# Patient Record
Sex: Female | Born: 1935
Health system: Southern US, Community
[De-identification: ages and names within clinical notes are randomized; demographics above are authoritative.]

## PROBLEM LIST (undated history)

## (undated) DIAGNOSIS — R0789 Other chest pain: Secondary | ICD-10-CM

## (undated) DIAGNOSIS — F411 Generalized anxiety disorder: Secondary | ICD-10-CM

## (undated) DIAGNOSIS — J209 Acute bronchitis, unspecified: Secondary | ICD-10-CM

## (undated) DIAGNOSIS — M545 Low back pain, unspecified: Secondary | ICD-10-CM

## (undated) DIAGNOSIS — E7800 Pure hypercholesterolemia, unspecified: Secondary | ICD-10-CM

## (undated) DIAGNOSIS — J329 Chronic sinusitis, unspecified: Secondary | ICD-10-CM

## (undated) DIAGNOSIS — R002 Palpitations: Secondary | ICD-10-CM

## (undated) DIAGNOSIS — D126 Benign neoplasm of colon, unspecified: Secondary | ICD-10-CM

## (undated) DIAGNOSIS — K219 Gastro-esophageal reflux disease without esophagitis: Secondary | ICD-10-CM

## (undated) DIAGNOSIS — R51 Headache: Secondary | ICD-10-CM

## (undated) DIAGNOSIS — E039 Hypothyroidism, unspecified: Secondary | ICD-10-CM

## (undated) DIAGNOSIS — K644 Residual hemorrhoidal skin tags: Secondary | ICD-10-CM

## (undated) DIAGNOSIS — J309 Allergic rhinitis, unspecified: Secondary | ICD-10-CM

## (undated) DIAGNOSIS — M1990 Unspecified osteoarthritis, unspecified site: Secondary | ICD-10-CM

## (undated) DIAGNOSIS — K589 Irritable bowel syndrome without diarrhea: Secondary | ICD-10-CM

## (undated) HISTORY — PX: CATARACT EXTRACTION: SUR2

## (undated) HISTORY — PX: VESICOVAGINAL FISTULA CLOSURE W/ TAH: SUR271

## (undated) HISTORY — DX: Low back pain: M54.5

## (undated) HISTORY — DX: Palpitations: R00.2

## (undated) HISTORY — DX: Chronic sinusitis, unspecified: J32.9

## (undated) HISTORY — DX: Gastro-esophageal reflux disease without esophagitis: K21.9

## (undated) HISTORY — DX: Residual hemorrhoidal skin tags: K64.4

## (undated) HISTORY — PX: ABDOMINAL HYSTERECTOMY: SHX81

## (undated) HISTORY — DX: Allergic rhinitis, unspecified: J30.9

## (undated) HISTORY — DX: Acute bronchitis, unspecified: J20.9

## (undated) HISTORY — DX: Benign neoplasm of colon, unspecified: D12.6

## (undated) HISTORY — PX: POLYPECTOMY: SHX149

## (undated) HISTORY — DX: Pure hypercholesterolemia, unspecified: E78.00

## (undated) HISTORY — DX: Generalized anxiety disorder: F41.1

## (undated) HISTORY — DX: Unspecified osteoarthritis, unspecified site: M19.90

## (undated) HISTORY — DX: Irritable bowel syndrome, unspecified: K58.9

## (undated) HISTORY — DX: Other chest pain: R07.89

## (undated) HISTORY — DX: Low back pain, unspecified: M54.50

## (undated) HISTORY — DX: Headache: R51

## (undated) HISTORY — DX: Hypothyroidism, unspecified: E03.9

---

## 1992-11-25 ENCOUNTER — Encounter: Payer: Self-pay | Admitting: Gastroenterology

## 1993-03-17 ENCOUNTER — Encounter: Payer: Self-pay | Admitting: Gastroenterology

## 1997-12-25 ENCOUNTER — Encounter: Payer: Self-pay | Admitting: Gastroenterology

## 1998-12-10 ENCOUNTER — Other Ambulatory Visit: Admission: RE | Admit: 1998-12-10 | Discharge: 1998-12-10 | Payer: Self-pay | Admitting: Gynecology

## 2000-01-17 ENCOUNTER — Encounter: Payer: Self-pay | Admitting: Gynecology

## 2000-01-17 ENCOUNTER — Encounter: Admission: RE | Admit: 2000-01-17 | Discharge: 2000-01-17 | Payer: Self-pay | Admitting: Gynecology

## 2000-01-20 ENCOUNTER — Encounter: Payer: Self-pay | Admitting: Gynecology

## 2000-01-20 ENCOUNTER — Encounter: Admission: RE | Admit: 2000-01-20 | Discharge: 2000-01-20 | Payer: Self-pay | Admitting: Gynecology

## 2000-03-24 ENCOUNTER — Encounter: Payer: Self-pay | Admitting: Pulmonary Disease

## 2000-03-24 ENCOUNTER — Ambulatory Visit (HOSPITAL_COMMUNITY): Admission: RE | Admit: 2000-03-24 | Discharge: 2000-03-24 | Payer: Self-pay | Admitting: Pulmonary Disease

## 2000-05-02 ENCOUNTER — Ambulatory Visit (HOSPITAL_COMMUNITY): Admission: RE | Admit: 2000-05-02 | Discharge: 2000-05-02 | Payer: Self-pay | Admitting: Gastroenterology

## 2000-05-02 ENCOUNTER — Encounter: Payer: Self-pay | Admitting: Gastroenterology

## 2000-05-07 ENCOUNTER — Inpatient Hospital Stay (HOSPITAL_COMMUNITY): Admission: EM | Admit: 2000-05-07 | Discharge: 2000-05-11 | Payer: Self-pay

## 2000-05-07 ENCOUNTER — Encounter: Payer: Self-pay | Admitting: Internal Medicine

## 2001-01-22 ENCOUNTER — Encounter: Payer: Self-pay | Admitting: Pulmonary Disease

## 2001-01-22 ENCOUNTER — Encounter: Admission: RE | Admit: 2001-01-22 | Discharge: 2001-01-22 | Payer: Self-pay | Admitting: Pulmonary Disease

## 2001-12-26 ENCOUNTER — Other Ambulatory Visit: Admission: RE | Admit: 2001-12-26 | Discharge: 2001-12-26 | Payer: Self-pay | Admitting: Gynecology

## 2002-01-10 ENCOUNTER — Encounter: Payer: Self-pay | Admitting: Gastroenterology

## 2002-01-10 ENCOUNTER — Ambulatory Visit (HOSPITAL_COMMUNITY): Admission: RE | Admit: 2002-01-10 | Discharge: 2002-01-10 | Payer: Self-pay | Admitting: Gastroenterology

## 2002-01-10 ENCOUNTER — Encounter (INDEPENDENT_AMBULATORY_CARE_PROVIDER_SITE_OTHER): Payer: Self-pay | Admitting: Specialist

## 2002-01-23 ENCOUNTER — Encounter: Payer: Self-pay | Admitting: Gynecology

## 2002-01-23 ENCOUNTER — Encounter: Admission: RE | Admit: 2002-01-23 | Discharge: 2002-01-23 | Payer: Self-pay | Admitting: Gynecology

## 2002-05-02 ENCOUNTER — Encounter: Payer: Self-pay | Admitting: Cardiology

## 2002-05-02 ENCOUNTER — Ambulatory Visit (HOSPITAL_COMMUNITY): Admission: RE | Admit: 2002-05-02 | Discharge: 2002-05-02 | Payer: Self-pay | Admitting: Cardiology

## 2002-11-26 ENCOUNTER — Encounter: Payer: Self-pay | Admitting: Gastroenterology

## 2003-01-28 ENCOUNTER — Encounter: Payer: Self-pay | Admitting: Pulmonary Disease

## 2003-01-28 ENCOUNTER — Encounter: Admission: RE | Admit: 2003-01-28 | Discharge: 2003-01-28 | Payer: Self-pay | Admitting: Pulmonary Disease

## 2003-06-21 ENCOUNTER — Emergency Department (HOSPITAL_COMMUNITY): Admission: EM | Admit: 2003-06-21 | Discharge: 2003-06-21 | Payer: Self-pay | Admitting: *Deleted

## 2003-06-21 ENCOUNTER — Encounter: Payer: Self-pay | Admitting: *Deleted

## 2004-02-03 ENCOUNTER — Other Ambulatory Visit: Admission: RE | Admit: 2004-02-03 | Discharge: 2004-02-03 | Payer: Self-pay | Admitting: Gynecology

## 2004-02-18 ENCOUNTER — Encounter: Admission: RE | Admit: 2004-02-18 | Discharge: 2004-02-18 | Payer: Self-pay | Admitting: Gynecology

## 2004-07-07 ENCOUNTER — Ambulatory Visit: Payer: Self-pay | Admitting: Pulmonary Disease

## 2004-07-13 ENCOUNTER — Ambulatory Visit: Payer: Self-pay | Admitting: Gastroenterology

## 2004-08-03 ENCOUNTER — Ambulatory Visit (HOSPITAL_COMMUNITY): Admission: RE | Admit: 2004-08-03 | Discharge: 2004-08-03 | Payer: Self-pay | Admitting: Gastroenterology

## 2004-08-03 ENCOUNTER — Ambulatory Visit: Payer: Self-pay | Admitting: Gastroenterology

## 2004-09-03 ENCOUNTER — Ambulatory Visit: Payer: Self-pay | Admitting: Pulmonary Disease

## 2004-10-20 ENCOUNTER — Ambulatory Visit: Payer: Self-pay | Admitting: Pulmonary Disease

## 2004-11-04 ENCOUNTER — Ambulatory Visit: Payer: Self-pay | Admitting: Pulmonary Disease

## 2004-11-12 ENCOUNTER — Ambulatory Visit: Payer: Self-pay | Admitting: Pulmonary Disease

## 2005-02-10 ENCOUNTER — Ambulatory Visit: Payer: Self-pay | Admitting: Pulmonary Disease

## 2005-02-23 ENCOUNTER — Encounter: Admission: RE | Admit: 2005-02-23 | Discharge: 2005-02-23 | Payer: Self-pay | Admitting: Gynecology

## 2005-02-23 ENCOUNTER — Other Ambulatory Visit: Admission: RE | Admit: 2005-02-23 | Discharge: 2005-02-23 | Payer: Self-pay | Admitting: Gynecology

## 2005-03-16 ENCOUNTER — Ambulatory Visit: Payer: Self-pay | Admitting: Gastroenterology

## 2005-04-19 ENCOUNTER — Ambulatory Visit: Payer: Self-pay | Admitting: Gastroenterology

## 2005-05-09 ENCOUNTER — Ambulatory Visit: Payer: Self-pay | Admitting: Gastroenterology

## 2005-05-11 ENCOUNTER — Ambulatory Visit: Payer: Self-pay | Admitting: Pulmonary Disease

## 2005-09-08 ENCOUNTER — Ambulatory Visit: Payer: Self-pay | Admitting: Pulmonary Disease

## 2005-10-11 ENCOUNTER — Ambulatory Visit: Payer: Self-pay | Admitting: Gastroenterology

## 2005-10-14 ENCOUNTER — Encounter: Admission: RE | Admit: 2005-10-14 | Discharge: 2005-10-14 | Payer: Self-pay | Admitting: Urology

## 2005-10-17 ENCOUNTER — Encounter (INDEPENDENT_AMBULATORY_CARE_PROVIDER_SITE_OTHER): Payer: Self-pay | Admitting: Specialist

## 2005-10-17 ENCOUNTER — Ambulatory Visit (HOSPITAL_BASED_OUTPATIENT_CLINIC_OR_DEPARTMENT_OTHER): Admission: RE | Admit: 2005-10-17 | Discharge: 2005-10-17 | Payer: Self-pay | Admitting: Urology

## 2005-12-23 ENCOUNTER — Ambulatory Visit: Payer: Self-pay | Admitting: Gastroenterology

## 2006-01-05 ENCOUNTER — Ambulatory Visit: Payer: Self-pay | Admitting: Pulmonary Disease

## 2006-03-01 ENCOUNTER — Other Ambulatory Visit: Admission: RE | Admit: 2006-03-01 | Discharge: 2006-03-01 | Payer: Self-pay | Admitting: Gynecology

## 2006-03-21 ENCOUNTER — Encounter: Admission: RE | Admit: 2006-03-21 | Discharge: 2006-03-21 | Payer: Self-pay | Admitting: Gynecology

## 2006-05-04 ENCOUNTER — Ambulatory Visit: Payer: Self-pay | Admitting: Pulmonary Disease

## 2006-11-02 ENCOUNTER — Ambulatory Visit: Payer: Self-pay | Admitting: Pulmonary Disease

## 2006-11-02 LAB — CONVERTED CEMR LAB
Basophils Absolute: 0.2 10*3/uL — ABNORMAL HIGH (ref 0.0–0.1)
Bilirubin, Direct: 0.1 mg/dL (ref 0.0–0.3)
Calcium: 10 mg/dL (ref 8.4–10.5)
Chloride: 104 meq/L (ref 96–112)
Direct LDL: 109.6 mg/dL
Eosinophils Absolute: 0.1 10*3/uL (ref 0.0–0.6)
Eosinophils Relative: 2.4 % (ref 0.0–5.0)
GFR calc Af Amer: 78 mL/min
GFR calc non Af Amer: 65 mL/min
HCT: 37.6 % (ref 36.0–46.0)
Hemoglobin: 12.5 g/dL (ref 12.0–15.0)
Lymphocytes Relative: 38.6 % (ref 12.0–46.0)
MCV: 86.3 fL (ref 78.0–100.0)
Monocytes Relative: 9.8 % (ref 3.0–11.0)
Neutro Abs: 2.6 10*3/uL (ref 1.4–7.7)
Platelets: 336 10*3/uL (ref 150–400)
Sodium: 141 meq/L (ref 135–145)
TSH: 3.98 microintl units/mL (ref 0.35–5.50)
Total CHOL/HDL Ratio: 4.6
Total Protein: 7.3 g/dL (ref 6.0–8.3)
VLDL: 43 mg/dL — ABNORMAL HIGH (ref 0–40)
WBC: 5.6 10*3/uL (ref 4.5–10.5)

## 2007-03-22 ENCOUNTER — Other Ambulatory Visit: Admission: RE | Admit: 2007-03-22 | Discharge: 2007-03-22 | Payer: Self-pay | Admitting: Gynecology

## 2007-04-04 ENCOUNTER — Encounter: Admission: RE | Admit: 2007-04-04 | Discharge: 2007-04-04 | Payer: Self-pay | Admitting: Gynecology

## 2007-04-30 ENCOUNTER — Ambulatory Visit: Payer: Self-pay | Admitting: Pulmonary Disease

## 2007-04-30 LAB — CONVERTED CEMR LAB
AST: 19 units/L (ref 0–37)
Albumin: 4.1 g/dL (ref 3.5–5.2)
BUN: 16 mg/dL (ref 6–23)
Basophils Absolute: 0 10*3/uL (ref 0.0–0.1)
CO2: 31 meq/L (ref 19–32)
Eosinophils Absolute: 0.2 10*3/uL (ref 0.0–0.6)
Eosinophils Relative: 2 % (ref 0.0–5.0)
GFR calc Af Amer: 62 mL/min
GFR calc non Af Amer: 51 mL/min
Glucose, Bld: 145 mg/dL — ABNORMAL HIGH (ref 70–99)
HCT: 37.7 % (ref 36.0–46.0)
Lymphocytes Relative: 19.2 % (ref 12.0–46.0)
MCHC: 33.6 g/dL (ref 30.0–36.0)
Monocytes Relative: 10.1 % (ref 3.0–11.0)
Platelets: 425 10*3/uL — ABNORMAL HIGH (ref 150–400)
Potassium: 4.9 meq/L (ref 3.5–5.1)
RBC: 4.45 M/uL (ref 3.87–5.11)
RDW: 12.6 % (ref 11.5–14.6)
Sodium: 149 meq/L — ABNORMAL HIGH (ref 135–145)
Total CHOL/HDL Ratio: 4.3
VLDL: 25 mg/dL (ref 0–40)
WBC: 11.9 10*3/uL — ABNORMAL HIGH (ref 4.5–10.5)

## 2007-05-07 ENCOUNTER — Ambulatory Visit: Payer: Self-pay | Admitting: Pulmonary Disease

## 2007-05-07 LAB — CONVERTED CEMR LAB
Ketones, ur: NEGATIVE mg/dL
Mucus, UA: NEGATIVE
Nitrite: POSITIVE — AB
Specific Gravity, Urine: 1.025 (ref 1.000–1.03)
Total Protein, Urine: 30 mg/dL — AB
Urine Glucose: NEGATIVE mg/dL
pH: 6 (ref 5.0–8.0)

## 2007-05-21 ENCOUNTER — Ambulatory Visit: Payer: Self-pay | Admitting: Gastroenterology

## 2007-10-29 DIAGNOSIS — M545 Low back pain, unspecified: Secondary | ICD-10-CM | POA: Insufficient documentation

## 2007-10-29 DIAGNOSIS — R0989 Other specified symptoms and signs involving the circulatory and respiratory systems: Secondary | ICD-10-CM | POA: Insufficient documentation

## 2007-10-29 DIAGNOSIS — F411 Generalized anxiety disorder: Secondary | ICD-10-CM | POA: Insufficient documentation

## 2007-10-29 DIAGNOSIS — J309 Allergic rhinitis, unspecified: Secondary | ICD-10-CM | POA: Insufficient documentation

## 2007-10-29 DIAGNOSIS — E7800 Pure hypercholesterolemia, unspecified: Secondary | ICD-10-CM | POA: Insufficient documentation

## 2007-10-29 DIAGNOSIS — K219 Gastro-esophageal reflux disease without esophagitis: Secondary | ICD-10-CM | POA: Insufficient documentation

## 2007-10-29 DIAGNOSIS — E039 Hypothyroidism, unspecified: Secondary | ICD-10-CM | POA: Insufficient documentation

## 2007-10-29 DIAGNOSIS — J329 Chronic sinusitis, unspecified: Secondary | ICD-10-CM | POA: Insufficient documentation

## 2007-10-29 DIAGNOSIS — D126 Benign neoplasm of colon, unspecified: Secondary | ICD-10-CM | POA: Insufficient documentation

## 2007-10-29 DIAGNOSIS — M1990 Unspecified osteoarthritis, unspecified site: Secondary | ICD-10-CM | POA: Insufficient documentation

## 2007-10-29 DIAGNOSIS — I10 Essential (primary) hypertension: Secondary | ICD-10-CM | POA: Insufficient documentation

## 2007-10-30 ENCOUNTER — Ambulatory Visit: Payer: Self-pay | Admitting: Pulmonary Disease

## 2007-10-30 DIAGNOSIS — R51 Headache: Secondary | ICD-10-CM | POA: Insufficient documentation

## 2007-10-30 DIAGNOSIS — R519 Headache, unspecified: Secondary | ICD-10-CM | POA: Insufficient documentation

## 2007-11-19 ENCOUNTER — Telehealth: Payer: Self-pay | Admitting: Pulmonary Disease

## 2007-11-20 ENCOUNTER — Telehealth: Payer: Self-pay | Admitting: Pulmonary Disease

## 2007-11-21 LAB — CONVERTED CEMR LAB
AST: 21 units/L (ref 0–37)
Alkaline Phosphatase: 66 units/L (ref 39–117)
BUN: 13 mg/dL (ref 6–23)
Basophils Absolute: 0.1 10*3/uL (ref 0.0–0.1)
Bilirubin, Direct: 0.2 mg/dL (ref 0.0–0.3)
CO2: 28 meq/L (ref 19–32)
Calcium: 9.8 mg/dL (ref 8.4–10.5)
Chloride: 103 meq/L (ref 96–112)
Creatinine, Ser: 0.9 mg/dL (ref 0.4–1.2)
Direct LDL: 117.6 mg/dL
GFR calc Af Amer: 78 mL/min
Glucose, Bld: 130 mg/dL — ABNORMAL HIGH (ref 70–99)
HCT: 37 % (ref 36.0–46.0)
Hemoglobin: 12.3 g/dL (ref 12.0–15.0)
MCHC: 33.2 g/dL (ref 30.0–36.0)
MCV: 84 fL (ref 78.0–100.0)
Monocytes Relative: 11.3 % — ABNORMAL HIGH (ref 3.0–11.0)
Platelets: 261 10*3/uL (ref 150–400)
RBC: 4.4 M/uL (ref 3.87–5.11)
RDW: 13.7 % (ref 11.5–14.6)
TSH: 0.34 microintl units/mL — ABNORMAL LOW (ref 0.35–5.50)
Total Protein: 7.1 g/dL (ref 6.0–8.3)
Triglycerides: 228 mg/dL (ref 0–149)
VLDL: 46 mg/dL — ABNORMAL HIGH (ref 0–40)

## 2007-11-23 ENCOUNTER — Telehealth (INDEPENDENT_AMBULATORY_CARE_PROVIDER_SITE_OTHER): Payer: Self-pay | Admitting: *Deleted

## 2007-12-06 ENCOUNTER — Telehealth (INDEPENDENT_AMBULATORY_CARE_PROVIDER_SITE_OTHER): Payer: Self-pay | Admitting: *Deleted

## 2008-02-25 ENCOUNTER — Telehealth: Payer: Self-pay | Admitting: Pulmonary Disease

## 2008-02-25 ENCOUNTER — Encounter: Payer: Self-pay | Admitting: Pulmonary Disease

## 2008-02-27 ENCOUNTER — Encounter: Payer: Self-pay | Admitting: Pulmonary Disease

## 2008-02-29 ENCOUNTER — Encounter: Payer: Self-pay | Admitting: Pulmonary Disease

## 2008-03-25 ENCOUNTER — Encounter: Payer: Self-pay | Admitting: Pulmonary Disease

## 2008-04-08 ENCOUNTER — Encounter: Admission: RE | Admit: 2008-04-08 | Discharge: 2008-04-08 | Payer: Self-pay | Admitting: Gynecology

## 2008-04-29 ENCOUNTER — Ambulatory Visit: Payer: Self-pay | Admitting: Pulmonary Disease

## 2008-04-29 DIAGNOSIS — R002 Palpitations: Secondary | ICD-10-CM | POA: Insufficient documentation

## 2008-07-02 ENCOUNTER — Telehealth: Payer: Self-pay | Admitting: Pulmonary Disease

## 2008-07-03 ENCOUNTER — Ambulatory Visit: Payer: Self-pay | Admitting: Adult Health

## 2008-07-03 DIAGNOSIS — R0789 Other chest pain: Secondary | ICD-10-CM | POA: Insufficient documentation

## 2008-07-04 ENCOUNTER — Ambulatory Visit: Payer: Self-pay | Admitting: Cardiovascular Disease

## 2008-07-09 ENCOUNTER — Ambulatory Visit: Payer: Self-pay

## 2008-07-09 ENCOUNTER — Encounter: Payer: Self-pay | Admitting: Pulmonary Disease

## 2008-08-13 ENCOUNTER — Ambulatory Visit: Payer: Self-pay | Admitting: Pulmonary Disease

## 2008-08-13 DIAGNOSIS — R7309 Other abnormal glucose: Secondary | ICD-10-CM | POA: Insufficient documentation

## 2008-08-15 ENCOUNTER — Ambulatory Visit: Payer: Self-pay | Admitting: Pulmonary Disease

## 2008-08-15 LAB — CONVERTED CEMR LAB
AST: 18 units/L (ref 0–37)
Alkaline Phosphatase: 57 units/L (ref 39–117)
Basophils Absolute: 0.2 10*3/uL — ABNORMAL HIGH (ref 0.0–0.1)
Basophils Relative: 2.9 % (ref 0.0–3.0)
Chloride: 107 meq/L (ref 96–112)
Eosinophils Absolute: 0.2 10*3/uL (ref 0.0–0.7)
Glucose, Bld: 112 mg/dL — ABNORMAL HIGH (ref 70–99)
HCT: 35.9 % — ABNORMAL LOW (ref 36.0–46.0)
Hemoglobin: 12.1 g/dL (ref 12.0–15.0)
Hgb A1c MFr Bld: 6.3 % — ABNORMAL HIGH (ref 4.6–6.0)
MCHC: 33.7 g/dL (ref 30.0–36.0)
Monocytes Relative: 11.3 % (ref 3.0–12.0)
Neutro Abs: 1.8 10*3/uL (ref 1.4–7.7)
Potassium: 3.6 meq/L (ref 3.5–5.1)
RBC: 4.05 M/uL (ref 3.87–5.11)
Total CHOL/HDL Ratio: 6
Total Protein: 7.3 g/dL (ref 6.0–8.3)
Triglycerides: 166 mg/dL — ABNORMAL HIGH (ref 0–149)
VLDL: 33 mg/dL (ref 0–40)
WBC: 5.3 10*3/uL (ref 4.5–10.5)

## 2008-08-20 ENCOUNTER — Telehealth (INDEPENDENT_AMBULATORY_CARE_PROVIDER_SITE_OTHER): Payer: Self-pay | Admitting: *Deleted

## 2008-09-01 ENCOUNTER — Telehealth (INDEPENDENT_AMBULATORY_CARE_PROVIDER_SITE_OTHER): Payer: Self-pay | Admitting: *Deleted

## 2008-09-15 ENCOUNTER — Ambulatory Visit: Payer: Self-pay | Admitting: Pulmonary Disease

## 2008-10-03 ENCOUNTER — Telehealth (INDEPENDENT_AMBULATORY_CARE_PROVIDER_SITE_OTHER): Payer: Self-pay | Admitting: *Deleted

## 2008-11-26 ENCOUNTER — Encounter: Payer: Self-pay | Admitting: Pulmonary Disease

## 2008-12-31 ENCOUNTER — Encounter: Admission: RE | Admit: 2008-12-31 | Discharge: 2008-12-31 | Payer: Self-pay | Admitting: Orthopedic Surgery

## 2009-02-24 ENCOUNTER — Ambulatory Visit: Payer: Self-pay | Admitting: Pulmonary Disease

## 2009-02-24 LAB — CONVERTED CEMR LAB
BUN: 20 mg/dL (ref 6–23)
CO2: 29 meq/L (ref 19–32)
Calcium: 10 mg/dL (ref 8.4–10.5)
Creatinine, Ser: 1.3 mg/dL — ABNORMAL HIGH (ref 0.4–1.2)
GFR calc non Af Amer: 41.86 mL/min (ref >60)
Potassium: 3.9 meq/L (ref 3.5–5.1)
Sodium: 142 meq/L (ref 135–145)

## 2009-04-22 ENCOUNTER — Encounter: Admission: RE | Admit: 2009-04-22 | Discharge: 2009-04-22 | Payer: Self-pay | Admitting: Pulmonary Disease

## 2009-04-29 ENCOUNTER — Telehealth: Payer: Self-pay | Admitting: Gastroenterology

## 2009-05-01 ENCOUNTER — Ambulatory Visit: Payer: Self-pay | Admitting: Gastroenterology

## 2009-05-01 DIAGNOSIS — K644 Residual hemorrhoidal skin tags: Secondary | ICD-10-CM | POA: Insufficient documentation

## 2009-05-01 DIAGNOSIS — K625 Hemorrhage of anus and rectum: Secondary | ICD-10-CM | POA: Insufficient documentation

## 2009-05-04 LAB — CONVERTED CEMR LAB
Basophils Absolute: 0.1 10*3/uL (ref 0.0–0.1)
Eosinophils Absolute: 0.1 10*3/uL (ref 0.0–0.7)
HCT: 36.6 % (ref 36.0–46.0)
Hemoglobin: 12.2 g/dL (ref 12.0–15.0)
Lymphs Abs: 2.7 10*3/uL (ref 0.7–4.0)
Monocytes Relative: 10 % (ref 3.0–12.0)
Neutrophils Relative %: 45.1 % (ref 43.0–77.0)
RBC: 4.02 M/uL (ref 3.87–5.11)
RDW: 13.6 % (ref 11.5–14.6)
WBC: 6.4 10*3/uL (ref 4.5–10.5)

## 2009-05-27 ENCOUNTER — Encounter: Payer: Self-pay | Admitting: Gastroenterology

## 2009-05-28 ENCOUNTER — Ambulatory Visit: Payer: Self-pay | Admitting: Gastroenterology

## 2009-05-28 DIAGNOSIS — K921 Melena: Secondary | ICD-10-CM | POA: Insufficient documentation

## 2009-05-28 DIAGNOSIS — Z860100 Personal history of colon polyps, unspecified: Secondary | ICD-10-CM | POA: Insufficient documentation

## 2009-06-19 ENCOUNTER — Encounter: Payer: Self-pay | Admitting: Gastroenterology

## 2009-06-22 ENCOUNTER — Telehealth: Payer: Self-pay | Admitting: Gastroenterology

## 2009-06-24 ENCOUNTER — Telehealth: Payer: Self-pay | Admitting: Gastroenterology

## 2009-06-24 ENCOUNTER — Telehealth (INDEPENDENT_AMBULATORY_CARE_PROVIDER_SITE_OTHER): Payer: Self-pay | Admitting: *Deleted

## 2009-06-26 ENCOUNTER — Encounter: Payer: Self-pay | Admitting: Gastroenterology

## 2009-07-01 ENCOUNTER — Telehealth (INDEPENDENT_AMBULATORY_CARE_PROVIDER_SITE_OTHER): Payer: Self-pay | Admitting: *Deleted

## 2009-07-28 ENCOUNTER — Telehealth: Payer: Self-pay | Admitting: Pulmonary Disease

## 2009-07-30 ENCOUNTER — Telehealth (INDEPENDENT_AMBULATORY_CARE_PROVIDER_SITE_OTHER): Payer: Self-pay | Admitting: *Deleted

## 2009-08-25 ENCOUNTER — Ambulatory Visit: Payer: Self-pay | Admitting: Pulmonary Disease

## 2009-08-26 DIAGNOSIS — K589 Irritable bowel syndrome without diarrhea: Secondary | ICD-10-CM | POA: Insufficient documentation

## 2009-08-26 LAB — CONVERTED CEMR LAB
AST: 20 units/L (ref 0–37)
Albumin: 4 g/dL (ref 3.5–5.2)
Basophils Relative: 1.1 % (ref 0.0–3.0)
Bilirubin, Direct: 0.1 mg/dL (ref 0.0–0.3)
CO2: 24 meq/L (ref 19–32)
Eosinophils Relative: 0.9 % (ref 0.0–5.0)
HCT: 37.2 % (ref 36.0–46.0)
HDL: 43.1 mg/dL (ref >39.00)
Lymphs Abs: 2 10*3/uL (ref 0.7–4.0)
MCHC: 33.9 g/dL (ref 30.0–36.0)
MCV: 92.5 fL (ref 78.0–100.0)
Monocytes Absolute: 0.5 10*3/uL (ref 0.1–1.0)
Monocytes Relative: 5.9 % (ref 3.0–12.0)
Neutro Abs: 6.5 10*3/uL (ref 1.4–7.7)
Potassium: 3.7 meq/L (ref 3.5–5.1)
RBC: 4.02 M/uL (ref 3.87–5.11)
RDW: 13.5 % (ref 11.5–14.6)
Specific Gravity, Urine: 1.025 (ref 1.000–1.030)
Total Protein, Urine: 100 mg/dL
Triglycerides: 177 mg/dL — ABNORMAL HIGH (ref 0.0–149.0)
Urine Glucose: NEGATIVE mg/dL
Vit D, 25-Hydroxy: 65 ng/mL (ref 30–89)

## 2009-08-31 ENCOUNTER — Telehealth (INDEPENDENT_AMBULATORY_CARE_PROVIDER_SITE_OTHER): Payer: Self-pay | Admitting: *Deleted

## 2009-09-10 ENCOUNTER — Telehealth: Payer: Self-pay | Admitting: Pulmonary Disease

## 2009-11-20 ENCOUNTER — Telehealth: Payer: Self-pay | Admitting: Pulmonary Disease

## 2009-11-23 ENCOUNTER — Telehealth: Payer: Self-pay | Admitting: Pulmonary Disease

## 2009-11-27 ENCOUNTER — Encounter: Payer: Self-pay | Admitting: Adult Health

## 2009-11-27 ENCOUNTER — Ambulatory Visit: Payer: Self-pay | Admitting: Pulmonary Disease

## 2009-11-29 DIAGNOSIS — R21 Rash and other nonspecific skin eruption: Secondary | ICD-10-CM | POA: Insufficient documentation

## 2009-11-30 ENCOUNTER — Ambulatory Visit: Payer: Self-pay | Admitting: Pulmonary Disease

## 2009-11-30 ENCOUNTER — Encounter: Payer: Self-pay | Admitting: Adult Health

## 2009-11-30 LAB — CONVERTED CEMR LAB
Alkaline Phosphatase: 58 units/L (ref 39–117)
BUN: 17 mg/dL (ref 6–23)
Basophils Absolute: 0.1 10*3/uL (ref 0.0–0.1)
Bilirubin Urine: NEGATIVE
Bilirubin Urine: NEGATIVE
Calcium: 9.7 mg/dL (ref 8.4–10.5)
Eosinophils Absolute: 0.1 10*3/uL (ref 0.0–0.7)
Folate: >20 ng/mL
GFR calc non Af Amer: 63.87 mL/min (ref >60)
Hemoglobin: 12.1 g/dL (ref 12.0–15.0)
Ketones, ur: NEGATIVE mg/dL
Ketones, ur: NEGATIVE mg/dL
Leukocytes, UA: NEGATIVE
Leukocytes, UA: NEGATIVE
Lymphocytes Relative: 32.8 % (ref 12.0–46.0)
MCV: 90.3 fL (ref 78.0–100.0)
Monocytes Absolute: 0.5 10*3/uL (ref 0.1–1.0)
Neutro Abs: 2.9 10*3/uL (ref 1.4–7.7)
Nitrite: NEGATIVE
Potassium: 3.8 meq/L (ref 3.5–5.1)
Sodium: 143 meq/L (ref 135–145)
Specific Gravity, Urine: 1.02 (ref 1.000–1.030)
TSH: 0.27 microintl units/mL — ABNORMAL LOW (ref 0.35–5.50)
Urobilinogen, UA: 0.2 (ref 0.0–1.0)
Vitamin B-12: 397 pg/mL (ref 211–911)
WBC: 5.4 10*3/uL (ref 4.5–10.5)
pH: 5.5 (ref 5.0–8.0)

## 2009-12-23 ENCOUNTER — Ambulatory Visit: Payer: Self-pay | Admitting: Pulmonary Disease

## 2010-01-28 ENCOUNTER — Ambulatory Visit: Payer: Self-pay | Admitting: Pulmonary Disease

## 2010-02-03 ENCOUNTER — Telehealth (INDEPENDENT_AMBULATORY_CARE_PROVIDER_SITE_OTHER): Payer: Self-pay | Admitting: *Deleted

## 2010-02-22 ENCOUNTER — Ambulatory Visit: Payer: Self-pay | Admitting: Pulmonary Disease

## 2010-03-29 ENCOUNTER — Telehealth: Payer: Self-pay | Admitting: Pulmonary Disease

## 2010-05-12 ENCOUNTER — Encounter: Admission: RE | Admit: 2010-05-12 | Discharge: 2010-05-12 | Payer: Self-pay | Admitting: Pulmonary Disease

## 2010-05-24 ENCOUNTER — Telehealth (INDEPENDENT_AMBULATORY_CARE_PROVIDER_SITE_OTHER): Payer: Self-pay | Admitting: *Deleted

## 2010-05-31 ENCOUNTER — Telehealth: Payer: Self-pay | Admitting: Pulmonary Disease

## 2010-08-13 ENCOUNTER — Telehealth (INDEPENDENT_AMBULATORY_CARE_PROVIDER_SITE_OTHER): Payer: Self-pay | Admitting: *Deleted

## 2010-08-23 ENCOUNTER — Ambulatory Visit: Payer: Self-pay | Admitting: Pulmonary Disease

## 2010-08-28 LAB — CONVERTED CEMR LAB
Albumin: 4.2 g/dL (ref 3.5–5.2)
Alkaline Phosphatase: 63 units/L (ref 39–117)
Calcium: 9.6 mg/dL (ref 8.4–10.5)
Direct LDL: 143.5 mg/dL
GFR calc non Af Amer: 55.18 mL/min — ABNORMAL LOW (ref >60.00)
Glucose, Bld: 102 mg/dL — ABNORMAL HIGH (ref 70–99)
HDL: 39.3 mg/dL (ref >39.00)
Sodium: 143 meq/L (ref 135–145)
Total Bilirubin: 0.4 mg/dL (ref 0.3–1.2)
Total CHOL/HDL Ratio: 6
VLDL: 31.2 mg/dL (ref 0.0–40.0)

## 2010-10-03 ENCOUNTER — Encounter: Payer: Self-pay | Admitting: Orthopedic Surgery

## 2010-10-05 ENCOUNTER — Telehealth: Payer: Self-pay | Admitting: Pulmonary Disease

## 2010-10-10 LAB — CONVERTED CEMR LAB
Alkaline Phosphatase: 60 units/L (ref 39–117)
Alkaline Phosphatase: 74 units/L (ref 39–117)
BUN: 14 mg/dL (ref 6–23)
Basophils Absolute: 0.1 10*3/uL (ref 0.0–0.1)
Basophils Relative: 1.5 % (ref 0.0–3.0)
Basophils Relative: 2.1 % (ref 0.0–3.0)
Calcium: 10.2 mg/dL (ref 8.4–10.5)
Calcium: 9.4 mg/dL (ref 8.4–10.5)
Chloride: 104 meq/L (ref 96–112)
Creatinine, Ser: 1 mg/dL (ref 0.4–1.2)
Eosinophils Absolute: 0.1 10*3/uL (ref 0.0–0.7)
Eosinophils Relative: 2 % (ref 0.0–5.0)
Eosinophils Relative: 2.4 % (ref 0.0–5.0)
GFR calc non Af Amer: 57 mL/min
Glucose, Bld: 109 mg/dL — ABNORMAL HIGH (ref 70–99)
HCT: 35.7 % — ABNORMAL LOW (ref 36.0–46.0)
HDL: 41.4 mg/dL (ref >39.00)
Hemoglobin: 12.1 g/dL (ref 12.0–15.0)
Hemoglobin: 12.6 g/dL (ref 12.0–15.0)
Hgb A1c MFr Bld: 6.6 % — ABNORMAL HIGH (ref 4.6–6.0)
LDL Cholesterol: 111 mg/dL — ABNORMAL HIGH (ref 0–99)
Lymphocytes Relative: 35.1 % (ref 12.0–46.0)
Lymphs Abs: 1.7 10*3/uL (ref 0.7–4.0)
MCHC: 34.1 g/dL (ref 30.0–36.0)
MCHC: 35.3 g/dL (ref 30.0–36.0)
Monocytes Absolute: 0.4 10*3/uL (ref 0.1–1.0)
Monocytes Absolute: 0.6 10*3/uL (ref 0.1–1.0)
Monocytes Relative: 7.2 % (ref 3.0–12.0)
Neutro Abs: 2.9 10*3/uL (ref 1.4–7.7)
Neutrophils Relative %: 50.6 % (ref 43.0–77.0)
Platelets: 233 10*3/uL (ref 150.0–400.0)
Potassium: 3.7 meq/L (ref 3.5–5.1)
RDW: 14.3 % (ref 11.5–14.6)
Saturation Ratios: 19 % — ABNORMAL LOW (ref 20.0–50.0)
Sodium: 141 meq/L (ref 135–145)
TSH: 4.02 microintl units/mL (ref 0.35–5.50)
Total Bilirubin: 0.4 mg/dL (ref 0.3–1.2)
Total Bilirubin: 0.6 mg/dL (ref 0.3–1.2)
Total CHOL/HDL Ratio: 4
Total CHOL/HDL Ratio: 5.1
VLDL: 31.6 mg/dL (ref 0.0–40.0)
VLDL: 37 mg/dL (ref 0–40)

## 2010-10-12 NOTE — Assessment & Plan Note (Signed)
Summary: Acute NP office visit - rash   Copy to:  n/a Primary Provider/Referring Provider:  Alroy Dust, MD  CC:  rash on palms of hands, arms and both legs w/ itching and redness - has seen dermatologist x3 for this, losss of appetite/losing weight, and and states that her GYN wonders if she has diabetes.  History of Present Illness: 77  y/o female with known history of GERD, Hypothyroidism, HTN.    ~  Dec09:  she was evaluated by Walker Kehr 10/09 for atypical CP-  EKG w/ NSSTTWA;  MYOVIEW was normal...  she had f/u w/ DrRDavis for Urology 6/09- doing OK...  she had f/u w/ DrLomax for GYN 7/09- doing OK...   ~  February 24, 2009:  under incr stress due to husb illness- fall w/ shoulder surg & post-op rehab at [facility]...  she's had incr LBP w/ eval DrRendall, & shot from [facility]- improved... she's been on the incr dose of Simvastatin (40mg ) for 6 mo & numbers improved...   ~  August 25, 2009:  seen by DrStark for incr GERD & rectal bleeding- he changed Protonix to Dexilant w/ improvement & wanted to sched EGD/ Colon but she refused due to the prep (we will request DrStark to consider the alternative prep for her...  BP controlled, doing satis on the Simva40 & Lopid600, & DM controlled on diet alone...   November 27, 2009 -Presents for work in visit. Presents for rash on palms of hands, arms and both legs w/ itching and redness - has seen dermatologist x3 for this, losss of appetite/losing weight, and states that her GYN wonders if she has diabetes. She was unaware that her borderline blood sugars/DM was consistent with "real DM". She is with her daughter today. Pt has been under an enormous amount of stress with husband illness/dementia. Trying move in with daughter to have more help with her husband. Lots of anxiety and depression . Now on zoloft 50mg . Helps but not controlling her anxiety well. Denies chest pain, dyspnea, orthopnea, hemoptysis, fever, n/v/d, edema, headache.         Medications Prior to Update: 1)  Nitroquick 0.4 Mg  Subl (Nitroglycerin) .... Take One Tablet Under The Tongue As Needed 2)  Atenolol 50 Mg  Tabs (Atenolol) .Marland Kitchen.. 1 By Mouth Once Daily 3)  Simvastatin 40 Mg Tabs (Simvastatin) .... Take 1 Tab By Mouth At Bedtime.Marland KitchenMarland Kitchen 4)  Lopid 600 Mg Tabs (Gemfibrozil) .... Take One Tablet By Mouth Two Times A Day 5)  Synthroid 88 Mcg  Tabs (Levothyroxine Sodium) .Marland Kitchen.. 1 Tab By Mouth Once Daily 6)  Dexilant 60 Mg Cpdr (Dexlansoprazole) .... Take 1 Tab By Mouth Once Daily - 30 Min Before The 1st Meal of The Day... 7)  Salsalate 750 Mg  Tabs (Salsalate) .... Take One Tabet By Mouth Two Times A Day 8)  Caltrate 600+d 600-400 Mg-Unit  Tabs (Calcium Carbonate-Vitamin D) .... Take 1 Tablet By Mouth Once A Day 9)  Multivitamins   Tabs (Multiple Vitamin) .... Take 1 Tablet By Mouth Once A Day 10)  Fioricet 50-325-40 Mg  Tabs (Butalbital-Apap-Caffeine) .... Take 1 Cap By Mouth Up To Every 6 H As Needed For Headache Pain... 11)  Librax 2.5-5 Mg Caps (Clidinium-Chlordiazepoxide) .... Take 1 Cap Up To Three Times Daily As Needed For Abd Cramping... 12)  Anucort-Hc 25 Mg Supp (Hydrocortisone Acetate) .... Put One in Rectum 2 Times Daily For 7 Days. 13)  Ciprofloxacin Hcl 250 Mg Tabs (Ciprofloxacin Hcl) .... Take One Tablet  By Mouth Two Times A Day Until Gone 14)  Diflucan 150 Mg Tabs (Fluconazole) .... Take As Directed 15)  Mmw .... One Teaspoon Gargle and Swallow Four Times Daily As Needed  Current Medications (verified): 1)  Nitroquick 0.4 Mg  Subl (Nitroglycerin) .... Take One Tablet Under The Tongue As Needed 2)  Atenolol 50 Mg  Tabs (Atenolol) .Marland Kitchen.. 1 By Mouth Once Daily 3)  Simvastatin 40 Mg Tabs (Simvastatin) .... Take 1 Tab By Mouth At Bedtime.Marland KitchenMarland Kitchen 4)  Lopid 600 Mg Tabs (Gemfibrozil) .... Take One Tablet By Mouth Two Times A Day 5)  Synthroid 88 Mcg  Tabs (Levothyroxine Sodium) .Marland Kitchen.. 1 Tab By Mouth Once Daily 6)  Dexilant 60 Mg Cpdr (Dexlansoprazole) .... Take 1 Tab By  Mouth Once Daily - 30 Min Before The 1st Meal of The Day... 7)  Salsalate 750 Mg  Tabs (Salsalate) .... Take One Tabet By Mouth Two Times A Day 8)  Caltrate 600+d 600-400 Mg-Unit  Tabs (Calcium Carbonate-Vitamin D) .... Take 1 Tablet By Mouth Once A Day 9)  Multivitamins   Tabs (Multiple Vitamin) .... Take 1 Tablet By Mouth Once A Day 10)  Fioricet 50-325-40 Mg  Tabs (Butalbital-Apap-Caffeine) .... Take 1 Cap By Mouth Up To Every 6 H As Needed For Headache Pain... 11)  Librax 2.5-5 Mg Caps (Clidinium-Chlordiazepoxide) .... Take 1 Cap Up To Three Times Daily As Needed For Abd Cramping... 12)  Anucort-Hc 25 Mg Supp (Hydrocortisone Acetate) .... Put One in Rectum 2 Times Daily For 7 Days. 13)  Mmw .... One Teaspoon Gargle and Swallow Four Times Daily As Needed 14)  Nystatin-Triamcinolone 100000-0.1 Unit/gm-% Oint (Nystatin-Triamcinolone) .... Use As Directed 15)  Clobex 0.05 % Lotn (Clobetasol Propionate) .... Use As Directed  Allergies (verified): 1)  ! Penicillin  Past History:  Family History: Last updated: 05-29-2009 mother deceased age 4 from heart problems father deceased age 14 from heart attack 1 sibling alive age 72  hx of heart problems 1 sibling alive age 63 1 sibling alive age 31  hx of heart disease 1 sibling alive age 20  hx of COPD 1 sister with hx of  breast cancer 1 brother with hx of prostate cancer daughter with breast cancer No FH of Colon Cancer:  Social History: Last updated: 04/29/2008 never smoked exposed to second hand smoke drinks 1 cup caffeine daily no etoh married 2 children  Risk Factors: Smoking Status: never (08/13/2008)  Past Medical History: HYPERTENSION (ICD-401.9) - controlled on ATENOLOL 50mg /d, and ASA 81mg /d... BP=122/68 here today & similar at home... denies HA, fatigue, visual changes, CP, dizziness, syncope, dyspnea, edema, etc...  ~  baseline EKG shows NSR, NSSTTWA...  ~  NuclearStressTest 8/03 showed ? mild ischemia apical/ lat  wall, EF=69%... referred to [facility]-  ~  cath 8/03 showed normal coronaries...  CHEST PAIN, ATYPICAL (ICD-786.59) & PALPITATIONS (ICD-785.1) - eval 10/09 by Walker Kehr w/ repeat Myoview= neg, no infarct or ischemia, EF= 81%...  HYPERCHOLESTEROLEMIA (ICD-272.0) - on ZOCOR 40mg /d & LOPID 600Bid...   ~  FLP 8/08 showed TChol 167, TG 127, HDL 38, LDL 103...  ~  FLP 2/09 showed TChol 208, TG 228, HDL 39, LDL 118... continue same med, better diet+ exercise.  ~  FLP 8/09 showed TChol 205, TG 187, HDL 40, LDL 124... rec to take meds regularly & repeat.  ~  FLP 12/09 on Simva20+LopidBid  showed TChol 218, TG 166, HDL 37, LDL 134... rec> incr Simva40.  ~  FLP 6/10 on Simva40+LopidBid showed TChol 187,  TG, 129, HDL 48, LDL 113  ~  FLP 12/10 on same showed TChol 182, TG 177, HDL 43, LDL 104... contin Rx, better diet.  DIABETES MELLITUS, BORDERLINE (ICD-790.29) - on diet alone...  ~  labs prev w/ FBS betw 125 - 145...  ~  labs 8/09 showed BS= 125, HgA1c= 6.6 on diet therapy...  ~  labs 12/09 showed BS= 112, A1c= 6.3.Marland Kitchen. rec> contin diet rx.  ~  labs 12/10 showed BS= 114, A1c= 6.3.Marland KitchenMarland Kitchen continue diet rx.  HYPOTHYROIDISM (ICD-244.9) - on SYNTHROID 61mcg/d...  ~  TSH 8/08 was 7.04 on Synthroid 83mcg/d, therefore increased to 78mcg/d...  ~  TSH 2/09 on 46mcg/d was 0.34... keep same...  ~  labs 8/09 showed TSH= 4.02  ~  labs 12/09 showed TSH= 4.23  ~  labs 12/10 showed TSH= 1.24  GERD (ICD-530.81) - protonix changed to DEXILANT 60mg /d & improved per pt...   ~  last EGD was 11/05 showing gastritis... rec for PPI Bid indefinitely...  ~  8/09: c/o incr nocturnal reflux symptoms- rec> change Prevacid to Protonix, add Reglan 10mg Qhs, elevate HOB, avoid caffeine, etc...    Past Surgical History: COLONIC POLYPS (ICD-211.3) & IRRITABLE BOWEL SYNDROME (ICD-564.1) - she requests LIBRAX but doesn't want the generic "yellow" caps, she wants the non-generic "blue" caps...  ~  last colonoscopy was 8/06 by DrStark showing  normal except for hems... f/u planned 77yrs.  ~  sched for colonoscopy 11/10 (due to blood in stool) but she refused due to prep required...  DEGENERATIVE JOINT DISEASE (ICD-715.90) & BACK PAIN, LUMBAR (ICD-724.2) - she is off the  ETODOLAC & FLEXERIL... takes calcium and vits...  Dr Priscille Kluver put her on SALSALATE 750mg Bid & referrred her to [facility] for shots in back- she reports improvement.  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  ANXIETY (ICD-300.00) - she was taking bothe Librium10 & Xanax 0.25mg ... she prefers the ALPRAZOLAM 0.25mg  Tid Prn...  Health Maintenance - GYN = DrLomax...  Urology = DrRDavis...  she had Shingles vaccine 6/09 at [facility]... she had PNEUMOVAX 2/09 here... she had the seasonal flu shot 10/09, and the 2010 Flu vaccine...   Hysterectomy Polypectomy Cataract Surgery both eyes  Review of Systems      See HPI  Vital Signs:  Patient profile:   77 year old female Height:      62 inches Weight:      127.25 pounds BMI:     23.36 O2 Sat:      97 % on Room air Temp:     97.0 degrees F oral Pulse rate:   61 / minute BP sitting:   130 / 78  (left arm) Cuff size:   regular  Vitals Entered By: Boone Master CNA (November 27, 2009 11:23 AM)  O2 Flow:  Room air CC: rash on palms of hands, arms and both legs w/ itching and redness - has seen dermatologist x3 for this, losss of appetite/losing weight, and states that her GYN wonders if she has diabetes Is Patient Diabetic? No Comments Medications reviewed with patient Daytime contact number verified with patient. Boone Master CNA  November 27, 2009 11:23 AM    Physical Exam  Additional Exam:  WD, WN, 77y/o WF in NAD... GENERAL:  Alert & oriented; pleasant & cooperative... HEENT:  [place]/AT, EOM-wnl, PERRLA, EACs-clear, TMs-wnl, NOSE-clear, THROAT-clear & wnl. NECK:  Supple w/ fairROM; no JVD; normal carotid impulses w/o bruits; no thyromegaly or nodules  palpated; no lymphadenopathy. CHEST:  Clear to P & A; without wheezes/ rales/ or rhonchi. HEART:  Regular Rhythm; without murmurs/ rubs/ or gallops. ABDOMEN:  Soft & nontender; normal bowel sounds; no organomegaly or masses detected. EXT: without deformities, mildarthritic changes; no varicose veins/ venous insuffic/ or edema. NEURO:  CN's intact; no focal neuro deficits... DERM: excoriated patch along palms     Impression & Recommendations:  Problem # 1:  ANXIETY (ICD-300.00)  Long discussion w/ pt and family regarding caregiver stress/strain REC: labs pending.  Increase Zoloft 100mg  once daily  May use xanax 0.25mg  1 by mouth two times a day as needed anxiety.  I will call with lab results.  follow up 4 weeks and as needed  Please contact office for sooner follow up if symptoms do not improve or worsen  Her updated medication list for this problem includes:    Zoloft 100 Mg Tabs (Sertraline hcl) .Marland Kitchen... 1 by mouth once daily    Alprazolam 0.25 Mg Tabs (Alprazolam) .Marland Kitchen... 1 by mouth two times a day as needed anxiety  Orders: Est. Patient Level IV ([address])  Problem # 2:  SKIN RASH (ICD-782.1)  ?etiology cont to follow up with dermatiology.   Her updated medication list for this problem includes:    Nystatin-triamcinolone 100000-0.1 Unit/gm-% Oint (Nystatin-triamcinolone) ..... Use as directed    Clobex 0.05 % Lotn (Clobetasol propionate) ..... Use as directed    Clobex 0.05 % Lotn (Clobetasol propionate) .Marland Kitchen... Apply to rash as needed  Orders: Est. Patient Level IV (60454)  Medications Added to Medication List This Visit: 1)  Nystatin-triamcinolone 100000-0.1 Unit/gm-% Oint (Nystatin-triamcinolone) .... Use as directed 2)  Clobex 0.05 % Lotn (Clobetasol propionate) .... Use as directed 3)  Zoloft 100 Mg Tabs (Sertraline hcl) .Marland Kitchen.. 1 by mouth once daily 4)  Alprazolam 0.25 Mg Tabs (Alprazolam) .Marland Kitchen.. 1 by mouth two times a day as needed anxiety 5)  Clobex 0.05 % Lotn (Clobetasol  propionate) .... Apply to rash as needed  Complete Medication List: 1)  Nitroquick 0.4 Mg Subl (Nitroglycerin) .... Take one tablet under the tongue as needed 2)  Atenolol 50 Mg Tabs (Atenolol) .Marland Kitchen.. 1 by mouth once daily 3)  Simvastatin 40 Mg Tabs (Simvastatin) .... Take 1 tab by mouth at bedtime.Marland KitchenMarland Kitchen 4)  Lopid 600 Mg Tabs (Gemfibrozil) .... Take one tablet by mouth two times a day 5)  Synthroid 88 Mcg Tabs (Levothyroxine sodium) .Marland Kitchen.. 1 tab by mouth once daily 6)  Dexilant 60 Mg Cpdr (Dexlansoprazole) .... Take 1 tab by mouth once daily - 30 min before the 1st meal of the day... 7)  Salsalate 750 Mg Tabs (Salsalate) .... Take one tabet by mouth two times a day 8)  Caltrate 600+d 600-400 Mg-unit Tabs (Calcium carbonate-vitamin d) .... Take 1 tablet by mouth once a day 9)  Multivitamins Tabs (Multiple vitamin) .... Take 1 tablet by mouth once a day 10)  Fioricet 50-325-40 Mg Tabs (Butalbital-apap-caffeine) .... Take 1 cap by mouth up to every 6 h as needed for headache pain... 11)  Librax 2.5-5 Mg Caps (Clidinium-chlordiazepoxide) .... Take 1 cap up to three times daily as needed for abd cramping... 12)  Anucort-hc 25 Mg Supp (Hydrocortisone acetate) .... Put one in rectum 2 times daily for 7 days. 13)  Mmw  .... One teaspoon gargle and swallow four times daily as needed 14)  Nystatin-triamcinolone 100000-0.1 Unit/gm-% Oint (Nystatin-triamcinolone) .... Use as directed 15)  Clobex 0.05 % Lotn (Clobetasol propionate) .... Use as directed 16)  Zoloft 100 Mg Tabs (Sertraline hcl) .Marland Kitchen.. 1 by mouth once daily 17)  Alprazolam 0.25 Mg Tabs (Alprazolam) .Marland Kitchen.. 1 by mouth two times a day as needed anxiety 18)  Clobex 0.05 % Lotn (Clobetasol propionate) .... Apply to rash as needed  Other Orders: T-Urine Culture (Spectrum Order) [id number]) TLB-BMP (Basic Metabolic Panel-BMET) (80048-METABOL) TLB-CBC Platelet - w/Differential (85025-CBCD) TLB-Hepatic/Liver Function Pnl (80076-HEPATIC) TLB-TSH (Thyroid  Stimulating Hormone) (84443-TSH) TLB-B12 + Folate Pnl (09811_91478-G95/AOZ) TLB-Udip w/ Micro (81001-URINE) TLB-A1C / Hgb A1C (Glycohemoglobin) (83036-A1C)  Patient Instructions: 1)  Increase Zoloft 100mg  once daily  2)  May use xanax 0.25mg  1 by mouth two times a day as needed anxiety.  3)  I will call with lab results.  4)  follow up 4 weeks and as needed  5)  Please contact office for sooner follow up if symptoms do not improve or worsen  Prescriptions: CLOBEX 0.05 % LOTN (CLOBETASOL PROPIONATE) Apply to rash as needed  #1 x 2   Entered and Authorized by:   Rubye Oaks NP   Signed by:   Adrienne Trombetta NP on 11/27/2009   Method used:   Print then Give to Patient   RxID:   [id number] ALPRAZOLAM 0.25 MG TABS (ALPRAZOLAM) 1 by mouth two times a day as needed anxiety  #60 x 0   Entered and Authorized by:   Rubye Oaks NP   Signed by:   Pietra Zuluaga NP on 11/27/2009   Method used:   Print then Give to Patient   RxID:   [id number] ZOLOFT 100 MG TABS (SERTRALINE HCL) 1 by mouth once daily  #30 x 5   Entered and Authorized by:   Rubye Oaks NP   Signed by:   Bea Duren NP on 11/27/2009   Method used:   Electronically to        [id number]* (retail)       [address]       [place], [place]  [address]       Ph: [phone] or [phone]       Fax: [phone]   RxID:   [id number]    Immunization History:  Influenza Immunization History:    Influenza:  historical (06/12/2009)  Pneumovax Immunization History:    Pneumovax:  historical (02/10/2009)

## 2010-10-12 NOTE — Progress Notes (Signed)
Summary: rx for sleep  Phone Note Call from Patient Call back at Home Phone [phone]   Caller: Daughter Reason for Call: Talk to Nurse Summary of Call: Patient's daughter--besty--calling asking if Dr. Kriste Basque would call her mother in rx for sleep. Initial call taken by: Lehman Prom,  August 13, 2010 4:18 PM  Follow-up for Phone Call        Spoke with daughter-pt has demenita-moved in with daughter-appt on 12-14; any way to get RX for something to help sleep until then.Reynaldo Minium CMA  August 13, 2010 4:21 PM    [facility] [address]   Additional Follow-up for Phone Call Additional follow up Details #1::        PER SN-okay to give pt Seroquel 50mg  #30 Take 1 by mouth at bedtime no refills.Reynaldo Minium CMA  August 13, 2010 4:30 PM    Called RX to pharmacy and daughter of pt is aware.Reynaldo Minium CMA  August 13, 2010 4:36 PM     New/Updated Medications: SEROQUEL 50 MG TABS (QUETIAPINE FUMARATE) take 1 by mouth at bedtime as needed Prescriptions: SEROQUEL 50 MG TABS (QUETIAPINE FUMARATE) take 1 by mouth at bedtime as needed  #30 x 0   Entered by:   Reynaldo Minium CMA   Authorized by:   Michele Mcalpine MD   Signed by:   Reynaldo Minium CMA on 08/13/2010   Method used:   [facility]        [facility] [phone][facility] (retail)       [address] [place], [place]  [address]       Ph: [phone] or [phone]       Fax: [phone]   RxID:   [id number]

## 2010-10-12 NOTE — Progress Notes (Signed)
Summary: pred dose pak clarrification  Phone Note From Pharmacy Call back at [phone]   Caller: Lodema Hong Call For: nadel  Summary of Call: Needs to know if prednisone is to be for 12 days or 6 days. Initial call taken by: Darletta Moll,  May 24, 2010 2:53 PM  Follow-up for Phone Call        Per previous phone note, pred dose pak is supposed to be 6 day pack.    [facility], [facility].  Informed her of this.  She verbalized understanding.  Follow-up by: Gweneth Dimitri RN,  May 24, 2010 3:10 PM

## 2010-10-12 NOTE — Progress Notes (Signed)
Summary: sinus infection  Phone Note Call from Patient Call back at [phone]   Caller: Daughter betsy Call For: nadel Summary of Call: need something for sinus infection called to pharmacy Lodema Hong [phone] Initial call taken by: Rickard Patience,  May 31, 2010 4:13 PM  Follow-up for Phone Call           daughter called me back---facial pain, with drainage in her throat--has been like this all weekend.  no fever, with dry cough.  please advise. thanks Allergies:    PCN Randell Loop [facility]  May 31, 2010 5:07 PM   Additional Follow-up for Phone Call Additional follow up Details #1::        per SN---ok for pt to have avelox 400mg  #7  1 by mouth once daily until gone. with no refills. this has been sent to pts pharmacy and pts daughter is aware Randell Loop [facility]  May 31, 2010 5:35 PM     New/Updated Medications: AVELOX 400 MG TABS (MOXIFLOXACIN HCL) Take 1 tablet by mouth once a day Prescriptions: AVELOX 400 MG TABS (MOXIFLOXACIN HCL) Take 1 tablet by mouth once a day  #7 x 0   Entered by:   Randell Loop CMA   Authorized by:   Michele Mcalpine MD   Signed by:   Randell Loop CMA on 05/31/2010   Method used:   Electronically to        [facility] [phone]* (retail)       [address] [place], [place]  [address]       Ph: [phone] or [phone]       Fax: [phone]   RxID:   [id number]

## 2010-10-12 NOTE — Progress Notes (Signed)
Summary: Rash > prednsione rx  Phone Note Call from Patient Call back at [phone]   Caller: Daughter//betsy Call For: nadel Summary of Call: States her mom is itching on hands, arms, and legs, wants to have a medrol dose pack called in, pls advise.//[facility] [phone] Initial call taken by: Darletta Moll,  May 24, 2010 1:51 PM  Follow-up for Phone Call        Called, spoke with pt's daughter Tamela Oddi.  Per Tamela Oddi, pt has a red, itchy rash on hands, legs, and arms x 2 wks.  Denies rash being warm to touch, denies using any new detergants.  Believes it is mainly from stress.  Has tried cortizone cream, benadryl, and aveno body wash and lotion -- no worse or better.  Requesting to try medrol dose pak.  [facility] Allergies: PCN Dr. Kriste Basque, pls advise.  Thanks! Follow-up by: Gweneth Dimitri RN,  May 24, 2010 2:19 PM  Additional Follow-up for Phone Call Additional follow up Details #1::        per SN----medrol is not avaliable----ok for pt to have prednisone dosepak  5mg     6 day pack.  thanks Randell Loop CMA  May 24, 2010 2:34 PM   [facility] pak [facility].  Called, spoke with pt's daughter, Tamela Oddi.  She is aware of this and aware of above per SN.  She verbalized understanding.   Additional Follow-up by: Gweneth Dimitri RN,  May 24, 2010 2:44 PM    New/Updated Medications: PREDNISONE (PAK) 5 MG TABS (PREDNISONE) take as directed Prescriptions: PREDNISONE (PAK) 5 MG TABS (PREDNISONE) take as directed  #1 x 0   Entered by:   Gweneth Dimitri RN   Authorized by:   Michele Mcalpine MD   Signed by:   Gweneth Dimitri RN on 05/24/2010   Method used:   Electronically to        [facility] [phone]* (retail)       [address] [place], [place]  [address]       Ph: [phone] or [phone]       Fax: [phone]   RxID:   [id number]

## 2010-10-12 NOTE — Progress Notes (Signed)
Summary: retuning call to [facility]  Phone Note Call from Patient Call back at [phone]   Caller: Mary Alexander - daughter Call For: Mary Alexander Reason for Call: Talk to Nurse, Lab or Test Results Summary of Call: returning call to [facility] - possibly lab results Initial call taken by: Eugene Gavia,  Feb 03, 2010 2:18 PM  Follow-up for Phone Call        per SN's append to 01-28-10 labs: labs ok, will discuss at 02-20-10 ov and vit d level looks good.  called spoke with Mary, pt's daughter.  advised of lab results as stated by SN.  Mary verbalized her understanding and was okay with waiting until ov as she will be coming with her mother. Follow-up by: Boone Master CNA/MA,  Feb 03, 2010 2:28 PM

## 2010-10-12 NOTE — Assessment & Plan Note (Signed)
Summary: rov 6 months///kp   Primary Care Provider:  Alroy Dust, MD  CC:  6 month ROV & review of mult medical issues....  History of Present Illness: 77 y/o WF here for a follow up visit... she has mult somatic complaints & mult medical problems as below:   ~  Dec09:  she was evaluated by Walker Kehr 10/09 for atypical CP-  EKG w/ NSSTTWA;  MYOVIEW was normal...  she had f/u w/ [facility] 6/09- doing OK...  she had f/u w/ DrLomax for GYN 7/09- doing OK...   ~  Jun10:  under incr stress due to husb illness- fall w/ shoulder surg & post-op rehab at [facility]...  she's had incr LBP w/ eval DrRendall, & shot from [facility]- improved... she's been on the incr dose of Simvastatin (40mg ) for 6 mo & numbers improved...  ~  Dec10:  seen by DrStark for incr GERD & rectal bleeding- he changed Protonix to Dexilant w/ improvement & wanted to sched EGD/ Colon but she refused due to the prep (she will request DrStark to consider the alternative prep for her)...  BP controlled, doing satis on the Simva40 & Lopid600, & DM controlled on diet alone...    ~  February 22, 2010:  states she's been doing "bad" w/ "yeast" found by DrLomax & NIOEVOJJ- now resolved... she had BMD 5/11 by GYN w/ TScores -2.0 in [facility], but OK in other sites & DrLomax only rec Caltrate + MVI... she's been taking Mobic/ Tramadol Prn for arthritis pain, and increased the Zoloft to 100mg /d w/ improvement... labs done 5/11> reviewed w/ pt today...    Current Problems:  ALLERGIC RHINITIS (ICD-477.9) - on ALLEGRA 180mg /d just Prn now... SINUSITIS (ICD-473.9) ASTHMATIC BRONCHITIS, ACUTE (ICD-466.0) - no recent exas etc...   HYPERTENSION (ICD-401.9) - controlled on ATENOLOL 50mg /d, and ASA 81mg /d... BP=130/66 here today & similar at home... denies HA, fatigue, visual changes, CP, dizziness, syncope, dyspnea, edema, etc...  ~  baseline EKG shows NSR, NSSTTWA...  ~  NuclearStressTest 8/03 showed ? mild ischemia apical/ lat wall,  EF=69%... referred to [facility]-  ~  cath 8/03 showed normal coronaries...  CHEST PAIN, ATYPICAL (ICD-786.59) & PALPITATIONS (ICD-785.1) - eval 10/09 by Walker Kehr w/ repeat Myoview= neg, no infarct or ischemia, EF= 81%...  she denies recent symptoms.  HYPERCHOLESTEROLEMIA (ICD-272.0) - on ZOCOR 40mg /d & LOPID 600Bid...   ~  FLP 8/08 showed TChol 167, TG 127, HDL 38, LDL 103...  ~  FLP 2/09 showed TChol 208, TG 228, HDL 39, LDL 118... continue same med, better diet+ exercise.  ~  FLP 8/09 showed TChol 205, TG 187, HDL 40, LDL 124... rec to take meds regularly & repeat.  ~  FLP 12/09 on Simva20+LopidBid  showed TChol 218, TG 166, HDL 37, LDL 134... rec> incr Simva40.  ~  FLP 6/10 on Simva40+LopidBid showed TChol 187, TG, 129, HDL 48, LDL 113  ~  FLP 12/10 on same showed TChol 182, TG 177, HDL 43, LDL 104... contin Rx, better diet.  ~  FLP 6/11 showed TChol 184, TG 158, HDL 41, LDL 111  DIABETES MELLITUS, BORDERLINE (ICD-790.29) - on diet alone...  ~  labs prev w/ FBS betw 125 - 145...  ~  labs 8/09 showed BS= 125, HgA1c= 6.6 on diet therapy...  ~  labs 12/09 showed BS= 112, A1c= 6.3.Marland Kitchen. rec> contin diet rx.  ~  labs 12/10 showed BS= 114, A1c= 6.3.Marland KitchenMarland Kitchen continue diet rx.  ~  labs 5/11 showed BS= 109, A1c= not  done.  HYPOTHYROIDISM (ICD-244.9) - on SYNTHROID 32mcg/d...  ~  TSH 8/08 was 7.04 on Synthroid 51mcg/d, therefore increased to 6mcg/d...  ~  TSH 2/09 on 46mcg/d was 0.34... keep same...  ~  labs 8/09 showed TSH= 4.02  ~  labs 12/09 showed TSH= 4.23  ~  labs 12/10 showed TSH= 1.24  ~  labs 5/11 showed TSH= 2.37  GERD (ICD-530.81) - protonix changed to DEXILANT 60mg /d & improved per pt...   ~  last EGD was 11/05 showing gastritis... rec for PPI Bid indefinitely...  ~  8/09: c/o incr nocturnal reflux symptoms- rec> change Prevacid to Protonix, add Reglan 10mg Qhs, elevate HOB, avoid caffeine, etc...  ~  10/10: saw DrStark w/ incr GERD- changed to DEXILANT 60mg /d which helped- planned EGD (not  done).  COLONIC POLYPS (ICD-211.3) & IRRITABLE BOWEL SYNDROME (ICD-564.1) - she requests LIBRAX but doesn't want the generic "yellow" caps, she wants the non-generic "blue" caps...  ~  last colonoscopy was 8/06 by DrStark showing normal except for hems... f/u planned 77yrs.  ~  sched for colonoscopy 11/10 (due to blood in stool) but she refused due to prep required...  DEGENERATIVE JOINT DISEASE (ICD-715.90) & BACK PAIN, LUMBAR (ICD-724.2) - she is off the  Etodolac & Flexeril... takes MOBIC 7.5mg  Prn & TRAMADOL 50mg  Prn + calcium and vits...  Dr Priscille Kluver referrred her to [facility] for shots in back- she reports improvement.  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  ANXIETY (ICD-300.00) - she was taking both Librium10 & Xanax 0.25mg ... she prefers the ALPRAZOLAM 0.25mg  Tid Prn... now on ZOLOFT 100mg /d & improved...   Health Maintenance - GYN = DrLomax...  Urology = DrRDavis...  she had Shingles vaccine 6/09 at [facility]... she had PNEUMOVAX 2/09 here... she had the seasonal flu shot 10/09, and the 2010 Flu vaccine...   Preventive Screening-Counseling & Management  Alcohol-Tobacco     Smoking Status: never  Allergies: 1)  ! Penicillin  Comments:  Nurse/Medical Assistant: The patient's medications and allergies were reviewed with the patient and were updated in the Medication and Allergy Lists.  Past History:  Past Medical History: ALLERGIC RHINITIS (ICD-477.9) SINUSITIS (ICD-473.9) ASTHMATIC BRONCHITIS, ACUTE (ICD-466.0) HYPERTENSION (ICD-401.9) CHEST PAIN, ATYPICAL (ICD-786.59) PALPITATIONS (ICD-785.1) HYPERCHOLESTEROLEMIA (ICD-272.0) DIABETES MELLITUS, BORDERLINE (ICD-790.29) HYPOTHYROIDISM (ICD-244.9) GERD (ICD-530.81) COLONIC POLYPS (ICD-211.3) IRRITABLE BOWEL SYNDROME (ICD-564.1) HEMORRHOIDS-EXTERNAL (ICD-455.3) DEGENERATIVE JOINT DISEASE (ICD-715.90) BACK PAIN, LUMBAR (ICD-724.2) HEADACHE, MIXED  (ICD-784.0) ANXIETY (ICD-300.00)  Past Surgical History: Hysterectomy Polypectomy Cataract Surgery both eyes  Family History: Reviewed history from 05/01/2009 and no changes required. mother deceased age 72 from heart problems father deceased age 61 from heart attack 1 sibling alive age 69  hx of heart problems 1 sibling alive age 49 1 sibling alive age 23  hx of heart disease 1 sibling alive age 60  hx of COPD 1 sister with hx of  breast cancer 1 brother with hx of prostate cancer daughter with breast cancer No FH of Colon Cancer:  Social History: Reviewed history from 04/29/2008 and no changes required. never smoked exposed to second hand smoke drinks 1 cup caffeine daily no etoh married 2 children  Review of Systems      See HPI       The patient complains of dyspnea on exertion and difficulty walking.  The patient denies anorexia, fever, weight loss, weight gain, vision loss, decreased hearing, hoarseness, chest pain, syncope, peripheral edema, prolonged cough, headaches, hemoptysis, abdominal pain, melena,  hematochezia, severe indigestion/heartburn, hematuria, incontinence, suspicious skin lesions, transient blindness, depression, unusual weight change, abnormal bleeding, enlarged lymph nodes, and angioedema.    Vital Signs:  Patient profile:   77 year old female Height:      62 inches Weight:      123 pounds BMI:     22.58 O2 Sat:      96 % on Room air Temp:     97.1 degrees F oral Pulse rate:   61 / minute BP sitting:   130 / 66  (right arm) Cuff size:   regular  Vitals Entered By: Randell Loop CMA (February 22, 2010 11:12 AM)  O2 Sat at Rest %:  96 O2 Flow:  Room air CC: 6 month ROV & review of mult medical issues... Is Patient Diabetic? Yes Pain Assessment Patient in pain? no      Comments meds updated today   Physical Exam  Additional Exam:  WD, WN, 77 y/o WF in NAD... GENERAL:  Alert & oriented; pleasant & cooperative... HEENT:  /AT, EOM-wnl,  PERRLA, EACs-clear, TMs-wnl, NOSE-clear, THROAT-clear & wnl. NECK:  Supple w/ fairROM; no JVD; normal carotid impulses w/o bruits; no thyromegaly or nodules palpated; no lymphadenopathy. CHEST:  Clear to P & A; without wheezes/ rales/ or rhonchi. HEART:  Regular Rhythm; without murmurs/ rubs/ or gallops. ABDOMEN:  Soft & nontender; normal bowel sounds; no organomegaly or masses detected. EXT: without deformities, mildarthritic changes; no varicose veins/ venous insuffic/ or edema. NEURO:  CN's intact; no focal neuro deficits... DERM:  No lesions noted; no rash etc...    MISC. Report  Procedure date:  02/22/2010  Findings:      DATA REVIEWED:  labs from 5/19.11... OV w/ TP 12/23/09... OV w/ DrLomax & BMD from 01/28/10... all reviewed w/ pt & discussed during the OV...  SN   Impression & Recommendations:  Problem # 1:  HYPERTENSION (ICD-401.9) BP controlled on meds>  continue same. Her updated medication list for this problem includes:    Atenolol 50 Mg Tabs (Atenolol) .Marland Kitchen... 1 by mouth once daily  Problem # 2:  HYPERCHOLESTEROLEMIA (ICD-272.0) FLP is stable/ unchanged... needs better diet & continue same meds for now.. we discussed poss switching the Simva40 to Crestor20 but she doesn't want the added expense & tol current regimen OK. Her updated medication list for this problem includes:    Simvastatin 40 Mg Tabs (Simvastatin) .Marland Kitchen... Take 1 tab by mouth at bedtime...    Lopid 600 Mg Tabs (Gemfibrozil) .Marland Kitchen... Take one tablet by mouth two times a day  Problem # 3:  DIABETES MELLITUS, BORDERLINE (ICD-790.29) Continue diet management...  Problem # 4:  HYPOTHYROIDISM (ICD-244.9) Stable on Synthroid 56mcg/d... Her updated medication list for this problem includes:    Synthroid 75 Mcg Tabs (Levothyroxine sodium) .Marland Kitchen... Take 1 tablet by mouth once a day  Problem # 5:  COLONIC POLYPS (ICD-211.3) She needs f/u colon & will contact DrStark when she is ready...  Problem # 6:  DEGENERATIVE  JOINT DISEASE (ICD-715.90) DJD followed by Toy Baker et al...  Her updated medication list for this problem includes:    Mobic 7.5 Mg Tabs (Meloxicam) .Marland Kitchen... Take 1 tablet by mouth once a day x7-10days w/ food, then as needed joint pain    Tramadol Hcl 50 Mg Tabs (Tramadol hcl) .Marland Kitchen... 1 tab by mouth every 6 hours as needed pain    Fioricet 50-325-40 Mg Tabs (Butalbital-apap-caffeine) .Marland Kitchen... Take 1 cap by mouth up to every 6 h as needed for  headache pain...  Problem # 7:  ANXIETY (ICD-300.00) Stable on meds... Her updated medication list for this problem includes:    Zoloft 100 Mg Tabs (Sertraline hcl) .Marland Kitchen... 1 by mouth once daily    Alprazolam 0.25 Mg Tabs (Alprazolam) .Marland Kitchen... 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 Her updated medication list for this problem includes:    Atenolol 50 Mg Tabs (Atenolol) .Marland Kitchen... 1 by mouth once daily    Mobic 7.5 Mg Tabs (Meloxicam) .Marland Kitchen... Take 1 tablet by mouth once a day x7-10days w/ food, then as needed joint pain    Tramadol Hcl 50 Mg Tabs (Tramadol hcl) .Marland Kitchen... 1 tab by mouth every 6 hours as needed pain    Fioricet 50-325-40 Mg Tabs (Butalbital-apap-caffeine) .Marland Kitchen... Take 1 cap by mouth up to every 6 h as needed for headache pain...  Complete Medication List: 1)  Mmw  .... One teaspoon gargle and swallow four times daily as needed 2)  Nitroquick 0.4 Mg Subl (Nitroglycerin) .... Take one tablet under the tongue as needed 3)  Atenolol 50 Mg Tabs (Atenolol) .Marland Kitchen.. 1 by mouth once daily 4)  Simvastatin 40 Mg Tabs (Simvastatin) .... Take 1 tab by mouth at bedtime.Marland KitchenMarland Kitchen 5)  Lopid 600 Mg Tabs (Gemfibrozil) .... Take one tablet by mouth two times a day 6)  Synthroid 75 Mcg Tabs (Levothyroxine sodium) .... Take 1 tablet by mouth once a day 7)  Dexilant 60 Mg Cpdr (Dexlansoprazole) .... Take 1 tab by mouth once daily - 30 min before the 1st meal of the day... 8)  Librax 2.5-5 Mg Caps  (Clidinium-chlordiazepoxide) .... Take 1 cap up to three times daily as needed for abd cramping... 9)  Mobic 7.5 Mg Tabs (Meloxicam) .... Take 1 tablet by mouth once a day x7-10days w/ food, then as needed joint pain 10)  Tramadol Hcl 50 Mg Tabs (Tramadol hcl) .Marland Kitchen.. 1 tab by mouth every 6 hours as needed pain 11)  Caltrate 600+d 600-400 Mg-unit Tabs (Calcium carbonate-vitamin d) .... Take 1 tablet by mouth once a day 12)  Multivitamins Tabs (Multiple vitamin) .... Take 1 tablet by mouth once a day 13)  Fioricet 50-325-40 Mg Tabs (Butalbital-apap-caffeine) .... Take 1 cap by mouth up to every 6 h as needed for headache pain... 14)  Zoloft 100 Mg Tabs (Sertraline hcl) .Marland Kitchen.. 1 by mouth once daily 15)  Alprazolam 0.25 Mg Tabs (Alprazolam) .Marland Kitchen.. 1 by mouth two times a day as needed anxiety 16)  Clobex 0.05 % Lotn (Clobetasol propionate) .... Apply to rash as needed 17)  Nystatin-triamcinolone 100000-0.1 Unit/gm-% Oint (Nystatin-triamcinolone) .... Use as directed  Patient Instructions: 1)  Today we updated your med list- see below.... 2)  We refilled your headache medication- Fioricet (use as needed)... 3)  We reviewed your recent lab data... keep up the good work... 4)  Call for any problems.Marland KitchenMarland Kitchen 5)  Please schedule a follow-up appointment in 6 months. Prescriptions: FIORICET 50-325-40 MG  TABS (BUTALBITAL-APAP-CAFFEINE) take 1 cap by mouth up to every 6 H as needed for headache pain...  #100 x 2   Entered and Authorized by:   Michele Mcalpine MD   Signed by:   Michele Mcalpine MD on 02/22/2010   Method used:   Print then Give to Patient   RxID:   [id number]

## 2010-10-12 NOTE — Progress Notes (Signed)
Summary: rx clarification  Phone Note From Pharmacy   Caller: [facility]  [address] # [id number]* Call For: Mandell Pangborn  Summary of Call: please clarify rx for LIBRAX. says family thinks this should be LIBRIUM. call lidia or any pharmacist at [facility] [address] [phone] Initial call taken by: Tivis Ringer, CNA,  March 29, 2010 3:15 PM  Follow-up for Phone Call        called and spoke with pt's daughter to get clarification on these meds and what exactly the pt is wanting.  Daughter states pt had previously been on Librium in 2010. Looks like OV with SN from 02-24-2009 the Librium was stopped d/t pt favoring Xanax instead.   And then Dec 2010, Vedia Pereyra was added to pt's med list,  Daughter states over the weekend they went to refill Librax and it was $125.  Daughter and pt would like to know if pt can go back to Librium which would only cost the pt $25.  SN out of office on vacation.  Will forward message to TP to address.  Aundra Millet Reynolds LPN  March 29, 2010 3:31 PM   Additional Follow-up for Phone Call Additional follow up Details #1::        no at ov pt requested librax for abd spasms-she asked for brand name this tx IBS symptoms.  she can have generic if she wants.  Additional Follow-up by: Tammy Parrett NP,  March 29, 2010 3:54 PM    Additional Follow-up for Phone Call Additional follow up Details #2::    called and spoke with  pharmacy and they stated that the generic librax for #90 is 112.32---this is due to pts medicare part d will not cover these types of meds---lmomtcb for pt to see if she wants to change to the generic of this med per TP. Randell Loop [facility]  March 29, 2010 4:04 PM    pharmacy called back and they already spoke with the pt and the pt stated that the $112 is too expensive for them to pay for the generic---pharmacy is going to price shop for this pt and they will let her know.  pt will call for any other questions Randell Loop [facility]  March 29, 2010 4:20 PM

## 2010-10-12 NOTE — Progress Notes (Signed)
Summary: refill MMW  Phone Note From Pharmacy   Caller: [facility]  [address] # [id number]* Call For: SN  Summary of Call: received a refill request for MMW. I called pt to ask if she was having symptoms. She c/o her throat and mouth "feeling funny" x few days.  She states by funny she means a little sore. Please advise if ok to refill MMW. Thanks.  Initial call taken by: Carron Curie CMA,  November 20, 2009 4:53 PM  Follow-up for Phone Call        per Sn ok to refill MMW. refill sent. pt aware. Carron Curie CMA  November 20, 2009 5:02 PM     New/Updated Medications: * MMW one teaspoon gargle and swallow four times daily as needed Prescriptions: MMW one teaspoon gargle and swallow four times daily as needed  #4 oz x 0   Entered by:   Carron Curie CMA   Authorized by:   Michele Mcalpine MD   Signed by:   Carron Curie CMA on 11/20/2009   Method used:   Telephoned to ...       [facility]  [address] # [id number]* (retail)       [address]       [place], [place]  [address]       Ph: [phone] or [phone]       Fax: [phone]   RxID:   [id number]

## 2010-10-12 NOTE — Progress Notes (Signed)
Summary: appt  Phone Note Call from Patient Call back at [phone]   Caller: daughter-[name] Call For: Mary Alexander Reason for Call: Talk to Nurse Summary of Call: pt needs to come in and see SN of TP soon.  Has had a lot of yeast and losing a lot of weight.  Afraid pt may be diabetic. Initial call taken by: Eugene Gavia,  November 23, 2009 4:11 PM  Follow-up for Phone Call        Pt daughter c/o pt having recurrent yeast infections and GYN was concerned about pt possibly having diabetes, so they advised her to f/u with PMD. Pt schedueld to see TP on 11/27/09 at 11:15. Carron Curie CMA  November 23, 2009 4:28 PM

## 2010-10-12 NOTE — Assessment & Plan Note (Signed)
Summary: Acute NP office visit - left knee pain   Copy to:  n/a Primary Provider/Referring Provider:  Alroy Dust, MD  CC:  left knee pain that started yesterday.  pt describes it as both a sharp pain and dull ache.  no redness or swelling in knee.Marland Kitchen  History of Present Illness: 77  y/o female with known history of GERD, Hypothyroidism, HTN.    ~  Dec09:  she was evaluated by Walker Kehr 10/09 for atypical CP-  EKG w/ NSSTTWA;  MYOVIEW was normal...  Marland Kitchen.   ~  February 24, 2009:  under incr stress due to husb illness- fall w/ shoulder surg & post-op rehab at [facility]...  she's had incr LBP w/ eval DrRendall, & shot from [facility]- improved...    ~  August 25, 2009:  seen by DrStark for incr GERD & rectal bleeding- he changed Protonix to Dexilant w/ improvement & wanted to sched EGD/ Colon but she refused due to the prep (we will request DrStark to consider the alternative prep for her...     November 27, 2009 -Presents for work in visit. Presents for rash on palms of hands, arms and both legs w/ itching and redness - has seen dermatologist x3 for this, losss of appetite/losing weight, and states that her GYN wonders if she has diabetes. She was unaware that her borderline blood sugars/DM was consistent with "real DM". She is with her daughter today. Pt has been under an enormous amount of stress with husband illness/dementia. Trying move in with daughter to have more help with her husband. Lots of anxiety and depression . Now on zoloft 50mg . Helps but not controlling her anxiety well.    December 23, 2009--Presents for an acute office visit .Complains of left knee pain that started yesterday.  pt describes it as both a sharp pain and dull ache.  no redness or swelling in knee.Worse w/ walking and bending. Last visit seen w/ increased stress/anxiety.  Zoloft increased to 100mg  she is feeling better.  Denies chest pain, dyspnea, orthopnea, hemoptysis, fever, n/v/d, edema, headache,recent travel, known injury,  syncope or palpitations.   Medications Prior to Update: 1)  Nitroquick 0.4 Mg  Subl (Nitroglycerin) .... Take One Tablet Under The Tongue As Needed 2)  Atenolol 50 Mg  Tabs (Atenolol) .Marland Kitchen.. 1 By Mouth Once Daily 3)  Simvastatin 40 Mg Tabs (Simvastatin) .... Take 1 Tab By Mouth At Bedtime.Marland KitchenMarland Kitchen 4)  Lopid 600 Mg Tabs (Gemfibrozil) .... Take One Tablet By Mouth Two Times A Day 5)  Synthroid 75 Mcg Tabs (Levothyroxine Sodium) .... Take 1 Tablet By Mouth Once A Day 6)  Dexilant 60 Mg Cpdr (Dexlansoprazole) .... Take 1 Tab By Mouth Once Daily - 30 Min Before The 1st Meal of The Day... 7)  Salsalate 750 Mg  Tabs (Salsalate) .... Take One Tabet By Mouth Two Times A Day 8)  Caltrate 600+d 600-400 Mg-Unit  Tabs (Calcium Carbonate-Vitamin D) .... Take 1 Tablet By Mouth Once A Day 9)  Multivitamins   Tabs (Multiple Vitamin) .... Take 1 Tablet By Mouth Once A Day 10)  Fioricet 50-325-40 Mg  Tabs (Butalbital-Apap-Caffeine) .... Take 1 Cap By Mouth Up To Every 6 H As Needed For Headache Pain... 11)  Librax 2.5-5 Mg Caps (Clidinium-Chlordiazepoxide) .... Take 1 Cap Up To Three Times Daily As Needed For Abd Cramping... 12)  Anucort-Hc 25 Mg Supp (Hydrocortisone Acetate) .... Put One in Rectum 2 Times Daily For 7 Days. 13)  Mmw .... One Teaspoon Gargle and Swallow  Four Times Daily As Needed 14)  Nystatin-Triamcinolone 100000-0.1 Unit/gm-% Oint (Nystatin-Triamcinolone) .... Use As Directed 15)  Clobex 0.05 % Lotn (Clobetasol Propionate) .... Use As Directed 16)  Zoloft 100 Mg Tabs (Sertraline Hcl) .Marland Kitchen.. 1 By Mouth Once Daily 17)  Alprazolam 0.25 Mg Tabs (Alprazolam) .Marland Kitchen.. 1 By Mouth Two Times A Day As Needed Anxiety 18)  Clobex 0.05 % Lotn (Clobetasol Propionate) .... Apply To Rash As Needed  Current Medications (verified): 1)  Nitroquick 0.4 Mg  Subl (Nitroglycerin) .... Take One Tablet Under The Tongue As Needed 2)  Atenolol 50 Mg  Tabs (Atenolol) .Marland Kitchen.. 1 By Mouth Once Daily 3)  Simvastatin 40 Mg Tabs (Simvastatin)  .... Take 1 Tab By Mouth At Bedtime.Marland KitchenMarland Kitchen 4)  Lopid 600 Mg Tabs (Gemfibrozil) .... Take One Tablet By Mouth Two Times A Day 5)  Synthroid 75 Mcg Tabs (Levothyroxine Sodium) .... Take 1 Tablet By Mouth Once A Day 6)  Dexilant 60 Mg Cpdr (Dexlansoprazole) .... Take 1 Tab By Mouth Once Daily - 30 Min Before The 1st Meal of The Day... 7)  Salsalate 750 Mg  Tabs (Salsalate) .... Take One Tabet By Mouth Two Times A Day As Needed 8)  Caltrate 600+d 600-400 Mg-Unit  Tabs (Calcium Carbonate-Vitamin D) .... Take 1 Tablet By Mouth Once A Day 9)  Multivitamins   Tabs (Multiple Vitamin) .... Take 1 Tablet By Mouth Once A Day 10)  Fioricet 50-325-40 Mg  Tabs (Butalbital-Apap-Caffeine) .... Take 1 Cap By Mouth Up To Every 6 H As Needed For Headache Pain... 11)  Librax 2.5-5 Mg Caps (Clidinium-Chlordiazepoxide) .... Take 1 Cap Up To Three Times Daily As Needed For Abd Cramping... 12)  Anucort-Hc 25 Mg Supp (Hydrocortisone Acetate) .... Put One in Rectum 2 Times Daily For 7 Days. 13)  Mmw .... One Teaspoon Gargle and Swallow Four Times Daily As Needed 14)  Nystatin-Triamcinolone 100000-0.1 Unit/gm-% Oint (Nystatin-Triamcinolone) .... Use As Directed 15)  Zoloft 100 Mg Tabs (Sertraline Hcl) .Marland Kitchen.. 1 By Mouth Once Daily 16)  Alprazolam 0.25 Mg Tabs (Alprazolam) .Marland Kitchen.. 1 By Mouth Two Times A Day As Needed Anxiety 17)  Clobex 0.05 % Lotn (Clobetasol Propionate) .... Apply To Rash As Needed  Allergies (verified): 1)  ! Penicillin  Past History:  Past Surgical History: Last updated: 11/27/2009 COLONIC POLYPS (ICD-211.3) & IRRITABLE BOWEL SYNDROME (ICD-564.1) - she requests LIBRAX but doesn't want the generic "yellow" caps, she wants the non-generic "blue" caps...  ~  last colonoscopy was 8/06 by DrStark showing normal except for hems... f/u planned 23yrs.  ~  sched for colonoscopy 11/10 (due to blood in stool) but she refused due to prep required...  DEGENERATIVE JOINT DISEASE (ICD-715.90) & BACK PAIN, LUMBAR  (ICD-724.2) - she is off the  ETODOLAC & FLEXERIL... takes calcium and vits...  Dr Priscille Kluver put her on SALSALATE 750mg Bid & referrred her to [facility] [place] for shots in back- she reports improvement.  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  ANXIETY (ICD-300.00) - she was taking bothe Librium10 & Xanax 0.25mg ... she prefers the ALPRAZOLAM 0.25mg  Tid Prn...  Health Maintenance - GYN = DrLomax...  Urology = DrRDavis...  she had Shingles vaccine 6/09 at [facility]... she had PNEUMOVAX 2/09 here... she had the seasonal flu shot 10/09, and the 2010 Flu vaccine...   Hysterectomy Polypectomy Cataract Surgery both eyes  Family History: Last updated: 2009-05-29 mother deceased age 31 from heart problems father deceased age 74 from heart attack  1 sibling alive age 37  hx of heart problems 1 sibling alive age 48 1 sibling alive age 76  hx of heart disease 1 sibling alive age 65  hx of COPD 1 sister with hx of  breast cancer 1 brother with hx of prostate cancer daughter with breast cancer No FH of Colon Cancer:  Social History: Last updated: 04/29/2008 never smoked exposed to second hand smoke drinks 1 cup caffeine daily no etoh married 2 children  Risk Factors: Smoking Status: never (08/13/2008)  Past Medical History: HYPERTENSION (ICD-401.9) - controlled on ATENOLOL 50mg /d, and ASA 81mg /d...   ~  baseline EKG shows NSR, NSSTTWA...  ~  NuclearStressTest 8/03 showed ? mild ischemia apical/ lat wall, EF=69%... referred to [facility]-  ~  cath 8/03 showed normal coronaries...  CHEST PAIN, ATYPICAL (ICD-786.59) & PALPITATIONS (ICD-785.1) - eval 10/09 by Walker Kehr w/ repeat Myoview= neg, no infarct or ischemia, EF= 81%...  HYPERCHOLESTEROLEMIA (ICD-272.0) - on ZOCOR 40mg /d & LOPID 600Bid...   ~  FLP 8/08 showed TChol 167, TG 127, HDL 38, LDL 103...  ~  FLP 2/09 showed TChol 208, TG 228, HDL 39, LDL 118... continue same med, better  diet+ exercise.  ~  FLP 8/09 showed TChol 205, TG 187, HDL 40, LDL 124... rec to take meds regularly & repeat.  ~  FLP 12/09 on Simva20+LopidBid  showed TChol 218, TG 166, HDL 37, LDL 134... rec> incr Simva40.  ~  FLP 6/10 on Simva40+LopidBid showed TChol 187, TG, 129, HDL 48, LDL 113  ~  FLP 12/10 on same showed TChol 182, TG 177, HDL 43, LDL 104... contin Rx, better diet.  DIABETES MELLITUS, BORDERLINE (ICD-790.29) - on diet alone...  ~  labs prev w/ FBS betw 125 - 145...  ~  labs 8/09 showed BS= 125, HgA1c= 6.6 on diet therapy...  ~  labs 12/09 showed BS= 112, A1c= 6.3.Marland Kitchen. rec> contin diet rx.  ~  labs 12/10 showed BS= 114, A1c= 6.3.Marland KitchenMarland Kitchen continue diet rx.  HYPOTHYROIDISM (ICD-244.9) - on SYNTHROID 31mcg/d...  ~  TSH 8/08 was 7.04 on Synthroid 31mcg/d, therefore increased to 48mcg/d...  ~  TSH 2/09 on 71mcg/d was 0.34... keep same...  ~  labs 8/09 showed TSH= 4.02  ~  labs 12/09 showed TSH= 4.23  ~  labs 12/10 showed TSH= 1.24  GERD (ICD-530.81) - protonix changed to [facility] 60mg /d & improved per pt...   ~  last EGD was 11/05 showing gastritis... rec for PPI Bid indefinitely...  ~  8/09: c/o incr nocturnal reflux symptoms- rec> change Prevacid to Protonix, add Reglan 10mg Qhs, elevate HOB, avoid caffeine, etc...    Review of Systems      See HPI  Vital Signs:  Patient profile:   77 year old female Height:      62 inches Weight:      125.25 pounds BMI:     22.99 O2 Sat:      96 % on Room air Temp:     97.0 degrees F oral Pulse rate:   69 / minute BP sitting:   136 / 72  (right arm) Cuff size:   regular  Vitals Entered By: Boone Master CNA (December 23, 2009 12:08 PM)  O2 Flow:  Room air CC: left knee pain that started yesterday.  pt describes it as both a sharp pain and dull ache.  no redness or swelling in knee. Is Patient Diabetic? No Comments Medications reviewed with patient Daytime contact number verified with patient. Boone Master CNA  December 23, 2009 12:09 PM     Physical Exam  Additional Exam:  WD, WN, 77y/o WF in NAD... GENERAL:  Alert & oriented; pleasant & cooperative... HEENT:  [place]/AT, EOM-wnl, PERRLA, EACs-clear, TMs-wnl, NOSE-clear, THROAT-clear & wnl. NECK:  Supple w/ fairROM; no JVD; normal carotid impulses w/o bruits; no thyromegaly or nodules palpated; no lymphadenopathy. CHEST:  Clear to P & A; without wheezes/ rales/ or rhonchi. HEART:  Regular Rhythm; without murmurs/ rubs/ or gallops. ABDOMEN:  Soft & nontender; normal bowel sounds; no organomegaly or masses detected. EXT: without deformities, mildarthritic changes; no varicose veins/ venous insuffic/ or edema. left knee tender along medial aspect , no redness or swelling, ROM nml but painful. increased pain w/ bending. neg homans sign, no calf tendrenss.  NEURO:  CN's intact; no focal neuro deficits... DERM: excoriated patch along palms -improved    Impression & Recommendations:  Problem # 1:  DEGENERATIVE JOINT DISEASE (ICD-715.90)  Knee pain suspect this is flare of DJD REC:  Mobic 7.5mg  once daily for 7-10 day w/ food then as needed joint pain  Alternate ice and heat to knee two times a day as needed  Tramadol 50mg  every 6 hr as needed for pain  Please contact office for sooner follow up if symptoms do not improve or worsen   Her updated medication list for this problem includes:    Salsalate 750 Mg Tabs (Salsalate) .Marland Kitchen... Take one tabet by mouth two times a day as needed    Fioricet 50-325-40 Mg Tabs (Butalbital-apap-caffeine) .Marland Kitchen... Take 1 cap by mouth up to every 6 h as needed for headache pain...    Mobic 7.5 Mg Tabs (Meloxicam) .Marland Kitchen... Take 1 tablet by mouth once a day x7-10days w/ food, then as needed joint pain    Tramadol Hcl 50 Mg Tabs (Tramadol hcl) .Marland Kitchen... 1 tab by mouth every 6 hours as needed pain  Orders: Est. Patient Level IV (16109) Prescription Created Electronically [phone])  Problem # 2:  ANXIETY (ICD-300.00)  improved on increased dose of zoloft.    Her updated medication lis for this problem includes:    Zoloft 100 Mg Tabs (Sertraline hcl) .Marland Kitchen... 1 by mouth once daily    Alprazolam 0.25 Mg Tabs (Alprazolam) .Marland Kitchen... 1 by mouth two times a day as needed anxiety  Orders: Est. Patient Level IV (09811)  Medications Added to Medication List This Visit: 1)  Salsalate 750 Mg Tabs (Salsalate) .... Take one tabet by mouth two times a day as needed 2)  Mobic 7.5 Mg Tabs (Meloxicam) .... Take 1 tablet by mouth once a day x7-10days w/ food, then as needed joint pain 3)  Tramadol Hcl 50 Mg Tabs (Tramadol hcl) .Marland Kitchen.. 1 tab by mouth every 6 hours as needed pain  Complete Medication List: 1)  Nitroquick 0.4 Mg Subl (Nitroglycerin) .... Take one tablet under the tongue as needed 2)  Atenolol 50 Mg Tabs (Atenolol) .Marland Kitchen.. 1 by mouth once daily 3)  Simvastatin 40 Mg Tabs (Simvastatin) .... Take 1 tab by mouth at bedtime.Marland KitchenMarland Kitchen 4)  Lopid 600 Mg Tabs (Gemfibrozil) .... Take one tablet by mouth two times a day 5)  Synthroid 75 Mcg Tabs (Levothyroxine sodium) .... Take 1 tablet by mouth once a day 6)  Dexilant 60 Mg Cpdr (Dexlansoprazole) .... Take 1 tab by mouth once daily - 30 min before the 1st meal of the day... 7)  Salsalate 750 Mg Tabs (Salsalate) .... Take one tabet by mouth two times a day as needed 8)  Caltrate  600+d 600-400 Mg-unit Tabs (Calcium carbonate-vitamin d) .... Take 1 tablet by mouth once a day 9)  Multivitamins Tabs (Multiple vitamin) .... Take 1 tablet by mouth once a day 10)  Fioricet 50-325-40 Mg Tabs (Butalbital-apap-caffeine) .... Take 1 cap by mouth up to every 6 h as needed for headache pain... 11)  Librax 2.5-5 Mg Caps (Clidinium-chlordiazepoxide) .... Take 1 cap up to three times daily as needed for abd cramping... 12)  Anucort-hc 25 Mg Supp (Hydrocortisone acetate) .... Put one in rectum 2 times daily for 7 days. 13)  Mmw  .... One teaspoon gargle and swallow four times daily as needed 14)  Nystatin-triamcinolone 100000-0.1 Unit/gm-%  Oint (Nystatin-triamcinolone) .... Use as directed 15)  Zoloft 100 Mg Tabs (Sertraline hcl) .Marland Kitchen.. 1 by mouth once daily 16)  Alprazolam 0.25 Mg Tabs (Alprazolam) .Marland Kitchen.. 1 by mouth two times a day as needed anxiety 17)  Clobex 0.05 % Lotn (Clobetasol propionate) .... Apply to rash as needed 18)  Mobic 7.5 Mg Tabs (Meloxicam) .... Take 1 tablet by mouth once a day x7-10days w/ food, then as needed joint pain 19)  Tramadol Hcl 50 Mg Tabs (Tramadol hcl) .Marland Kitchen.. 1 tab by mouth every 6 hours as needed pain  Patient Instructions: 1)  Mobic 7.5mg  once daily for 7-10 day w/ food then as needed joint pain  2)  Alternate ice and heat to knee two times a day as needed  3)  Tramadol 50mg  every 6 hr as needed for pain  4)  Please contact office for sooner follow up if symptoms do not improve or worsen  Prescriptions: TRAMADOL HCL 50 MG TABS (TRAMADOL HCL) 1 tab by mouth every 6 hours as needed pain  #45 x 0   Entered by:   Boone Master CNA   Authorized by:   Rubye Oaks NP   Signed by:   Boone Master CNA on 12/23/2009   Method used:   Telephoned to ...       [facility]  [address] # [id number]* (retail)       [address]       [place], [place]  [address]       Ph: [phone] or [phone]       Fax: [phone]   RxID:   [id number] MOBIC 7.5 MG TABS (MELOXICAM) Take 1 tablet by mouth once a day x7-10days w/ food, then as needed joint pain  #45 x 0   Entered by:   Boone Master CNA   Authorized by:   Rubye Oaks NP   Signed by:   Boone Master CNA on 12/23/2009   Method used:   Telephoned to ...       [facility]  [address] [id number]* (retail)       [address]       [place], [place]  [address]       Ph: [phone] or [phone]       Fax: [phone]   RxID:   [id number]

## 2010-10-14 NOTE — Progress Notes (Signed)
Summary: lab results fax to Dr Priscille Kluver  Phone Note Call from Patient Call back at Home Phone [phone]   Caller: daughter/betsy Call For: dr Kriste Basque Summary of Call: patients daughter Tamela Oddi phoned they need her a copy of her last lab results faxed to her back doctor Dr. Priscille Kluver at [phone]. They can be reached at [phone] Initial call taken by: Vedia Coffer,  October 05, 2010 4:17 PM  Follow-up for Phone Call        called and spoke with pt and she is aware that last labs have been faxed over to Dr. Sable Feil office Randell Loop CMA  October 05, 2010 5:02 PM

## 2010-10-14 NOTE — Assessment & Plan Note (Signed)
Summary: rov 6 months///kp   Primary Care Provider:  Alroy Dust, MD  CC:  6 month ROV & review of mult medical problems....  History of Present Illness: 77 y/o WF here for a follow up visit... she has mult somatic complaints & mult medical problems as below:   ~  February 22, 2010:  states she's been doing "bad" w/ "yeast" found by DrLomax & ZOXWRUEA- now resolved... she had BMD 5/11 by GYN w/ [facility] -[facility], but OK in other sites & DrLomax only rec Caltrate + MVI... she's been taking Mobic/ Tramadol Prn for arthritis pain, and increased the Zoloft to 100mg /d w/ improvement... labs done 5/11> all essentially WNL.   ~  August 23, 2010:  She & husb moved in w/ daugh... c/o headaches & insomnia... tried Seroquel w/o benefit she says & wants Ambien (took one of her daughter's & it helped)... also wants refill Fioricet for HAs & MMW for throat...    Current Problems:  ALLERGIC RHINITIS (ICD-477.9) - on ALLEGRA 180mg /d just Prn now... SINUSITIS (ICD-473.9) ASTHMATIC BRONCHITIS, ACUTE (ICD-466.0) - no recent exas etc...   HYPERTENSION (ICD-401.9) - controlled on ATENOLOL 50mg /d, and ASA 81mg /d... BP=136/70 here today & similar at home... denies HA, fatigue, visual changes, CP, dizziness, syncope, dyspnea, edema, etc...  ~  baseline EKG shows NSR, NSSTTWA...  ~  NuclearStressTest 8/03 showed ? mild ischemia apical/ lat wall, EF=69%... referred to [facility]-  ~  cath 8/03 showed normal coronaries...  CHEST PAIN, ATYPICAL (ICD-786.59) & PALPITATIONS (ICD-785.1) - eval 10/09 by Walker Kehr w/ repeat Myoview= neg, no infarct or ischemia, EF= 81%...  she denies recent symptoms.  HYPERCHOLESTEROLEMIA (ICD-272.0) - on ZOCOR 40mg /d & LOPID 600Bid...   ~  FLP 8/08 showed TChol 167, TG 127, HDL 38, LDL 103...  ~  FLP 2/09 showed TChol 208, TG 228, HDL 39, LDL 118... continue same med, better diet+ exercise.  ~  FLP 8/09 showed TChol 205, TG 187, HDL 40, LDL 124... rec to take meds regularly &  repeat.  ~  FLP 12/09 on Simva20+LopidBid  showed TChol 218, TG 166, HDL 37, LDL 134... rec> incr Simva40.  ~  FLP 6/10 on Simva40+LopidBid showed TChol 187, TG, 129, HDL 48, LDL 113  ~  FLP 12/10 on same showed TChol 182, TG 177, HDL 43, LDL 104... contin Rx, better diet.  ~  FLP 6/11 showed TChol 184, TG 158, HDL 41, LDL 111  ~  FLP 12/11 showed TChol 221, TG 156, HDL 39, LDL 144... needs same med, much better diet!  DIABETES MELLITUS, BORDERLINE (ICD-790.29) - on diet alone...  ~  labs prev w/ FBS betw 125 - 145...  ~  labs 8/09 showed BS= 125, HgA1c= 6.6 on diet therapy...  ~  labs 12/09 showed BS= 112, A1c= 6.3.Marland Kitchen. rec> contin diet rx.  ~  labs 12/10 showed BS= 114, A1c= 6.3.Marland KitchenMarland Kitchen continue diet rx.  ~  labs 12/11 showed BS= 102, A1c= 6.1  HYPOTHYROIDISM (ICD-244.9) - on SYNTHROID 28mcg/d...  ~  TSH 8/08 was 7.04 on Synthroid 46mcg/d, therefore increased to 100mcg/d...  ~  TSH 2/09 on 14mcg/d was 0.34... keep same...  ~  labs 8/09 showed TSH= 4.02  ~  labs 12/09 showed TSH= 4.23  ~  labs 12/10 showed TSH= 1.24  ~  labs 5/11 showed TSH= 2.37  GERD (ICD-530.81) - Protonix changed to DEXILANT 60mg /d & improved per pt...   ~  last EGD was 11/05 showing gastritis... rec for PPI Bid indefinitely...  ~  8/09: c/o incr nocturnal reflux symptoms- rec> change Prevacid to Protonix, add Reglan 10mg Qhs, elevate HOB, avoid caffeine, etc...  ~  10/10: saw DrStark w/ incr GERD- changed to DEXILANT 60mg /d which helped- planned EGD (not done).  COLONIC POLYPS (ICD-211.3) & IRRITABLE BOWEL SYNDROME (ICD-564.1) - she requests LIBRAX but doesn't want the generic "yellow" caps, she wants the non-generic "blue" caps...  ~  last colonoscopy was 8/06 by DrStark showing normal except for hems... f/u planned 65yrs.  ~  sched for colonoscopy 11/10 (due to blood in stool) but she refused due to prep required...  DEGENERATIVE JOINT DISEASE (ICD-715.90) & BACK PAIN, LUMBAR (ICD-724.2) - she is off the  Etodolac &  Flexeril... off prev Mobic & Tramadol... she takes SALSALATE 750mg  Prn from DrRendall + calcium and vits...  Dr Priscille Kluver referrred her to [facility] for shots in back- she reports improvement.  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  ANXIETY (ICD-300.00) - she was taking both Librium10 & Xanax 0.25mg ... she prefers the ALPRAZOLAM 0.25mg  Tid Prn... now on ZOLOFT 100mg /d & improved...   Health Maintenance - GYN = DrLomax...  Urology = DrRDavis...  she had Shingles vaccine 6/09 at [facility]... she had PNEUMOVAX 2/09 here... she had the seasonal flu shot 10/09, and the 2010 Flu vaccine...    Preventive Screening-Counseling & Management  Alcohol-Tobacco     Smoking Status: never  Allergies: 1)  ! Penicillin  Comments:  Nurse/Medical Assistant: The patient's medications and allergies were reviewed with the patient and were updated in the Medication and Allergy Lists.  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 mother deceased age 51 from heart problems father deceased age 43 from heart attack 1 sibling alive age 60  hx of heart problems 1 sibling alive age 63 1 sibling alive age 22  hx of heart disease 1 sibling alive age 28  hx of COPD 1 sister with hx of   breast cancer 1 brother with hx of prostate cancer daughter with breast cancer No FH of Colon Cancer:  Social History: Reviewed history from 04/29/2008 and no changes required. never smoked exposed to second hand smoke drinks 1 cup caffeine daily no etoh married 2 children  Review of Systems      See HPI       The patient complains of dyspnea on exertion, headaches, severe indigestion/heartburn, muscle weakness, difficulty walking, and depression.  The patient denies anorexia, fever, weight loss, weight gain, vision loss, decreased hearing, hoarseness, chest pain, syncope, peripheral edema, prolonged cough, hemoptysis, abdominal pain, melena, hematochezia, hematuria, incontinence, suspicious skin lesions, transient blindness, unusual weight change, abnormal bleeding, enlarged lymph nodes, and angioedema.    Vital Signs:  Patient profile:   76 year old female Height:      62 inches Weight:      124.13 pounds BMI:     22.79 O2 Sat:      98 % on Room air Temp:     96.9 degrees F oral Pulse rate:   70 / minute BP sitting:  136 / 70  (right arm) Cuff size:   regular  Vitals Entered By: Randell Loop CMA (August 23, 2010 12:20 PM)  O2 Sat at Rest %:  98 O2 Flow:  Room air CC: 6 month ROV & review of mult medical problems... Is Patient Diabetic? No Pain Assessment Patient in pain? no      Comments meds updated today with pt   Physical Exam  Additional Exam:  WD, WN, 76 y/o WF in NAD... GENERAL:  Alert & oriented; pleasant & cooperative... HEENT:  Marie/AT, EOM-wnl, PERRLA, EACs-clear, TMs-wnl, NOSE-clear, THROAT-clear & wnl. NECK:  Supple w/ fairROM; no JVD; normal carotid impulses w/o bruits; no thyromegaly or nodules palpated; no lymphadenopathy. CHEST:  Clear to P & A; without wheezes/ rales/ or rhonchi. HEART:  Regular Rhythm; without murmurs/ rubs/ or gallops. ABDOMEN:  Soft & nontender; normal bowel sounds; no organomegaly or masses detected. EXT: without  deformities, mildarthritic changes; no varicose veins/ venous insuffic/ or edema. NEURO:  CN's intact; no focal neuro deficits... DERM:  No lesions noted; no rash etc...    MISC. Report  Procedure date:  08/23/2010  Findings:      FASTING LABS done 08/23/10 & results communicated to the pt via the 'phone tree'...  SN   Impression & Recommendations:  Problem # 1:  HYPERTENSION (ICD-401.9) BP controlled>  continue current meds... Her updated medication list for this problem includes:    Atenolol 50 Mg Tabs (Atenolol) .Marland Kitchen... 1 by mouth once daily  Orders: TLB-BMP (Basic Metabolic Panel-BMET) (80048-METABOL) TLB-Hepatic/Liver Function Pnl (80076-HEPATIC) TLB-Lipid Panel (80061-LIPID) TLB-A1C / Hgb A1C (Glycohemoglobin) (83036-A1C)  Problem # 2:  HYPERCHOLESTEROLEMIA (ICD-272.0) Chol results aren't as good as prev>  continue same meds, needs much better diet... Her updated medication list for this problem includes:    Simvastatin 40 Mg Tabs (Simvastatin) .Marland Kitchen... Take 1 tab by mouth at bedtime...    Lopid 600 Mg Tabs (Gemfibrozil) .Marland Kitchen... Take one tablet by mouth two times a day  Problem # 3:  DIABETES MELLITUS, BORDERLINE (ICD-790.29) Stable on diet alone, does not require meds for this...  Problem # 4:  GERD (ICD-530.81) Continue PPI therapy & f/u w/ DrStarke... Her updated medication list for this problem includes:    Dexilant 60 Mg Cpdr (Dexlansoprazole) .Marland Kitchen... Take 1 tab by mouth once daily - 30 min before the 1st meal of the day...    Librax 2.5-5 Mg Caps (Clidinium-chlordiazepoxide) .Marland Kitchen... Take 1 cap up to three times daily as needed for abd cramping...  Problem # 5:  DEGENERATIVE JOINT DISEASE (ICD-715.90) Followed & treated by DrRendall... The following medications were removed from the medication list:    Mobic 7.5 Mg Tabs (Meloxicam) .Marland Kitchen... Take 1 tablet by mouth once a day x7-10days w/ food, then as needed joint pain    Tramadol Hcl 50 Mg Tabs (Tramadol hcl) .Marland Kitchen... 1 tab by  mouth every 6 hours as needed pain Her updated medication list for this problem includes:    Salsalate 750 Mg Tabs (Salsalate) .Marland Kitchen... Take as directed by drrendall    Fioricet 50-325-40 Mg Tabs (Butalbital-apap-caffeine) .Marland Kitchen... Take 1 cap by mouth up to every 6 h as needed for headache pain...  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 The following medications were removed from the medication list:    Mobic 7.5 Mg Tabs (Meloxicam) .Marland Kitchen... Take 1 tablet by mouth once a day x7-10days w/ food, then as needed joint pain    Tramadol Hcl 50  Mg Tabs (Tramadol hcl) .Marland Kitchen... 1 tab by mouth every 6 hours as needed pain Her updated medication list for this problem includes:    Atenolol 50 Mg Tabs (Atenolol) .Marland Kitchen... 1 by mouth once daily    Salsalate 750 Mg Tabs (Salsalate) .Marland Kitchen... Take as directed by drrendall    Fioricet 50-325-40 Mg Tabs (Butalbital-apap-caffeine) .Marland Kitchen... Take 1 cap by mouth up to every 6 h as needed for headache pain...  Problem # 7:  ANXIETY (ICD-300.00) She wants to continue both of these meds and wants a new perscription for Ambien... asked to watch carefully as this is alot of medication for a frail 77 y/o lady... daugh is  a nurse & will watch her closely... Her updated medication list for this problem includes:    Zoloft 100 Mg Tabs (Sertraline hcl) .Marland Kitchen... 1 by mouth once daily    Alprazolam 0.25 Mg Tabs (Alprazolam) .Marland Kitchen... 1 by mouth two times a day as needed anxiety  Problem # 8:  OTHER MEDICAL PROBLEMS AS NOTED>>>  Complete Medication List: 1)  Mmw  .... One teaspoon gargle and swallow four times daily as needed 2)  Nitroquick 0.4 Mg Subl (Nitroglycerin) .... Take one tablet under the tongue as needed 3)  Atenolol 50 Mg Tabs (Atenolol) .Marland Kitchen.. 1 by mouth once daily 4)  Simvastatin 40 Mg Tabs (Simvastatin) .... Take 1 tab by mouth at bedtime.Marland KitchenMarland Kitchen 5)  Lopid 600 Mg Tabs (Gemfibrozil) .... Take one tablet by mouth two times a day 6)  Synthroid 75 Mcg  Tabs (Levothyroxine sodium) .... Take 1 tablet by mouth once a day 7)  Dexilant 60 Mg Cpdr (Dexlansoprazole) .... Take 1 tab by mouth once daily - 30 min before the 1st meal of the day... 8)  Librax 2.5-5 Mg Caps (Clidinium-chlordiazepoxide) .... Take 1 cap up to three times daily as needed for abd cramping... 9)  Citrucel 500 Mg Tabs (Methylcellulose (laxative)) .... Take 2 tablets by mouth once daily 10)  Salsalate 750 Mg Tabs (Salsalate) .... Take as directed by drrendall 11)  Caltrate 600+d Plus 600-400 Mg-unit Tabs (Calcium carbonate-vit d-min) .... Take 1 tab by mouth once daily... 12)  Multivitamins Tabs (Multiple vitamin) .... Take 1 tablet by mouth once a day 13)  Fioricet 50-325-40 Mg Tabs (Butalbital-apap-caffeine) .... Take 1 cap by mouth up to every 6 h as needed for headache pain... 14)  Zoloft 100 Mg Tabs (Sertraline hcl) .Marland Kitchen.. 1 by mouth once daily 15)  Alprazolam 0.25 Mg Tabs (Alprazolam) .Marland Kitchen.. 1 by mouth two times a day as needed anxiety 16)  Ambien 10 Mg Tabs (Zolpidem tartrate) .... Take 1 tab by mouth at bedtime as needed for sleep... 17)  Clobex 0.05 % Lotn (Clobetasol propionate) .... Apply to rash as needed  Patient Instructions: 1)  Today we updated your med list- see below.... 2)  We refilled the meds you requested, and wrote a new perscription for AMBIEN to take as needed for sleep.Marland KitchenMarland Kitchen 3)  We also did your follow up FASTING blood work...  please call the "phone tree" in a few days for your lab results.Marland KitchenMarland Kitchen 4)  call for any questions.Marland KitchenMarland Kitchen 5)  Stay as active as possible... 6)  Please schedule a follow-up appointment in 6 months. Prescriptions: AMBIEN 10 MG TABS (ZOLPIDEM TARTRATE) take 1 tab by mouth at bedtime as needed for sleep...  #30 x 6   Entered and Authorized by:   Michele Mcalpine MD   Signed by:   Michele Mcalpine MD on 08/23/2010  Method used:   Print then Give to Patient   RxID:   [id number] FIORICET 50-325-40 MG  TABS [facility]) take 1 cap by  mouth up to every 6 H as needed for headache pain...  #100 x 2   Entered and Authorized by:   Michele Mcalpine MD   Signed by:   Michele Mcalpine MD on 08/23/2010   Method used:   Print then Give to Patient   RxID:   [id number] MMW one teaspoon gargle and swallow four times daily as needed  #4 oz x prn   Entered and Authorized by:   Michele Mcalpine MD   Signed by:   Michele Mcalpine MD on 08/23/2010   Method used:   Print then Give to Patient   RxID:   [id number]    Immunization History:  Influenza Immunization History:    Influenza:  historical (05/25/2010)

## 2010-10-15 NOTE — Letter (Signed)
Summary: Beather Arbour MD  Beather Arbour MD   Imported By: Sherian Rein 02/11/2010 14:38:40  _____________________________________________________________________  External Attachment:    Type:   Image     Comment:   External Document

## 2010-11-24 ENCOUNTER — Ambulatory Visit: Payer: Medicare Other | Attending: Orthopedic Surgery | Admitting: Physical Therapy

## 2010-11-24 DIAGNOSIS — M546 Pain in thoracic spine: Secondary | ICD-10-CM | POA: Insufficient documentation

## 2010-11-24 DIAGNOSIS — IMO0001 Reserved for inherently not codable concepts without codable children: Secondary | ICD-10-CM | POA: Insufficient documentation

## 2011-01-25 NOTE — Assessment & Plan Note (Signed)
[facility]                            CARDIOLOGY OFFICE NOTE   NAME:Mary Alexander, Mary Alexander                     MRN:          [id number]  DATE:07/04/2008                            DOB:          [birth year]    A 77 year old patient referred for chest pain.   The patient has had a previously normal heart cath 6 years ago by Dr.  Samule Ohm.  She was sick last Tuesday.  She awoke with severe chest pain.  She does have a history of some cervical spine problems.  She was having  some problems with her neck and back, it then radiated to the front, the  pain lasted for a few hours.  She did not feel good all day.  She  stayed in the bed.  There was no fever.  No pleuritic component.  She  has had some mild shortness of breath with this.   Coronary risk factors include high blood pressure and  hypercholesterolemia.   The patient has not had a recurrent episode.   She was referred here for her episode of chest pain, abnormal EKG.  In  reviewing her EKG, she has sinus rhythm with nonspecific ST-T wave  changes, poor R wave progression, and lateral ST-segment depression.   REVIEW OF SYSTEMS:  Otherwise negative.   PAST MEDICAL HISTORY:  1. Previous hysterectomy.  2. Cervical spine problem.  3. Hypertension.  4. Hypercholesterolemia.  5. History of reflux with hemorrhoids.  6. History of adenomatous polyps.   ALLERGIES:  She is allergic to PENICILLIN.   MEDICATIONS:  1. An aspirin a day.  2. Atenolol 50 a day.  3. Zocor 20 a day.  4. Lopid 600 b.i.d.  5. Flaxseed oil.  6. Synthroid 88 mcg a day.  7. Protonix 40 b.i.d.  8. Reglan.   SOCIAL HISTORY:  She lives with her feeble 42 year old husband.  She has  a Yorkie at home, Research scientist (medical), and watches TV.  She had a  granddaughter with her today, who eats launch with her every day and  takes very good care of her.  Interestingly, the granddaughter works for  a [employer] type lab processing unit.   FAMILY  HISTORY:  Noncontributory.   PHYSICAL EXAMINATION:  GENERAL:  An elderly white female in no distress.  VITAL SIGNS:  Pulse 61 and regular, respiratory rate 14, afebrile.  Blood pressure 136/88.  HEENT:  Unremarkable.  NECK:  Carotids normal without bruit.  No lymphadenopathy, thyromegaly,  or JVP elevation.  LUNGS:  Clear with good diaphragmatic motion.  No wheezing.  CARDIAC:  S1 and S2.  Normal heart sounds.  PMI normal.  ABDOMEN:  Benign.  Bowel sounds positive.  No AAA.  No tenderness.  No  bruit.  No hepatosplenomegaly or hepatojugular reflux or tenderness.  EXTREMITIES:  Distal pulses intact.  No edema.  NEUROLOGIC:  Nonfocal.  SKIN:  Warm and dry.  MUSCULOSKELETAL:  No muscular weakness.   As indicated, previous heart cath from May 02, 2002, was reviewed and  was normal.  Filling pressures were also normal.  EKG was reviewed and  was  abnormal as indicated.  Blood work was reviewed from August and  February 2008.  The patient has normal creatinine.  She does not have  significant anemia.   IMPRESSION:  1. Chest pain with abnormal EKG.  Follow up adenosine Myoview.  The      patient is unable to walk on a treadmill and has baseline abnormal      EKG changes.  2. Hypertension, currently well controlled.  Continue current dose of      atenolol.  3. Hypercholesterolemia.  Continue Zocor.  Lipid and liver profile in      6 months.  4. Hypothyroidism.  Continue Synthroid 88 mcg a day.  TSH and T4 have      been normal in the past year.  5. History of reflux.  Follow with Dr. Perrin Maltese and continue Protonix.   The patient will be seen on a p.r.n. basis if her adenosine Myoview  study is normal.     Theron Arista C. Eden Emms, MD, [facility]  Electronically Signed    PCN/MedQ  DD: 07/04/2008  DT: 07/05/2008  Job #: [id number]   cc:   Lonzo Cloud. Kriste Basque, MD

## 2011-01-25 NOTE — Assessment & Plan Note (Signed)
[facility]                         GASTROENTEROLOGY OFFICE NOTE   NAME:Mary Alexander, Mary Alexander                     MRN:          [id number]  DATE:05/21/2007                            DOB:          [birth year]    Ms. Niese returns for followup of GERD. Reflux symptoms are under  very good control. She does have occasional breakthrough symptoms of  mild regurgitation and burning, but these symptoms are infrequent. She  notes no dysphagia or odynophagia. She has had intermittent problems  with small volume hematochezia with hard bowel movements. Her symptoms  have responded rapidly to hemorrhoidal suppositories and creams. She has  known internal and external hemorrhoids from her most recent colonoscopy  and her internal hemorrhoids were injected at that time. Since that  time, she has had less frequent symptoms. She notes no change in stool  caliber, weight loss, abdominal pain or rectal pain.   CURRENT MEDICATIONS:  Listed on the chart; updated and reviewed.   MEDICATION ALLERGIES:  PENICILLIN.   PHYSICAL EXAMINATION:  In no acute distress. Weight 136 pounds, blood  pressure 130/64, pulse 84 and regular.  CHEST: Clear to auscultation bilaterally.  CARDIAC: Regular rate and rhythm without murmurs.  ABDOMEN: Soft and nontender.  Normoactive bowel sounds.   ASSESSMENT/PLAN:  1. Gastroesophageal reflux disease with a prior history of a peptic      stricture. No recurrent dysphagia and reflux symptoms are under      very good control. Renew Prevacid  30 mg p.o. b.i.d. taken 30-45      minutes before breakfast and dinner. Continue all standard anti-      reflux measures. May use TUMS and Gas-X on a p.r.n. basis.  2. Mildly symptomatic hemorrhoids. Continue with Anamantle 2.5% cream      and Anusol HC suppositories p.r.n. If her hemorrhoidal symptoms      remain refractory, she will return for followup. She is encouraged      to maintain a high fiber diet  with adequate fluid intake longterm.  3. Personal history of adenomatous colon polyps. Recall colonoscopy      recommended for August 2011.     Venita Lick. Russella Dar, MD, [facility]  Electronically Signed    MTS/MedQ  DD: 05/21/2007  DT: 05/21/2007  Job #: [id number]

## 2011-01-27 ENCOUNTER — Other Ambulatory Visit: Payer: Self-pay | Admitting: *Deleted

## 2011-01-27 MED ORDER — NITROGLYCERIN 0.4 MG SL SUBL: 0.4000 mg | SUBLINGUAL_TABLET | SUBLINGUAL | Status: DC | PRN

## 2011-01-28 NOTE — Discharge Summary (Signed)
[facility]  Patient:    Mary Alexander, Mary Alexander                     MRN: [id number] Adm. Date:  [id number] Disc. Date: [id number] Attending:  Verdene Lennert                           Discharge Summary  DATE OF BIRTH:  [birth year]  FINAL DIAGNOSES: 1. Acute exacerbation of asthmatic bronchitis with hypoxemic respiratory    failure, pO2 51 on room air at admission. 2. Complications of attempted computed tomographic scan with extravasation of    the dorsum of the left hand--fluid was expressed and no subsequent    problems identified. 3. History of sinusitis. 4. Hyperlipidemia, on Zocor. 5. History of gastroesophageal reflux disease with small hiatus hernia and    stricture. 6. Recent abnormalities computed tomographic scan with 1 cm right adrenal    adenoma and increased liver density compatible with steatosis. 7. History of colon polyps. 8. History of anemia. 9. History of anxiety and headaches, treated with Inderal, amitriptyline,    and Fioricet by Dr. Nolon Nations.  BRIEF HISTORY:  Mary Alexander is a 77 year old white female, followed for general medical purposes.  She has a history of asthmatic bronchitis, sinusitis, hyperlipidemia, gastroesophageal reflux disease, and colon polyps. She has also had a history of headaches and anxiety for which she takes Inderal, amitriptyline, Librium, and p.r.n. Fioricet per Dr. Nolon Nations.  She presented to the emergency room on August 26, admitted by Dr. Posey Rea in my absence with a several day history of increasing cough, chest congestion, green sputum production, and presented to the ER for evaluation.  In the emergency room she was found to be hypoxemic with a pO2 of 51 on room air and was admitted for respiratory insufficiency with an acute exacerbation of her COPD.  PHYSICAL EXAMINATION:  GENERAL:  Physical examination at the time of admission revealed a 77 year old lady in mild distress from  dyspnea.  VITAL SIGNS:  Blood pressure was 147/70, pulse 100 and regular, respirations 18 per minute, slightly shallow, temperature 97 degrees.  CHEST:  Examination revealed bilateral rhonchi.  CARDIAC:  Examination was unremarkable with a normal rhythm, S1, S2.  No rubs or gallops heard.  ABDOMEN:  Soft and nontender without evidence of organomegaly or masses.  EXTREMITIES:  No cyanosis, clubbing, or edema.  LABORATORY DATA:  Chest x-ray on admission showed no active disease and was cleared per radiology.  Attempt at a CT scan to rule out a possibility of an occult pulmonary embolus was done on May 07, 2000.  The patient had extravasation of the contrast material into the dorsum of her hand.  This was expressed and evaluated by Dr. Benna Dunks and no problems developed.  Only 30 cc of contrast occurred intravenously which was not adequate for study of her vessels.  No other abnormalities were detected.  Blood gases showed a pH of 7.45, pCO2 40, pO2 51 on room air in the emergency room.  Hemoglobin 13.2, hematocrit 37.6, white count 14,000, this improved to 11,500 with antibiotic therapy.  Sodium 137, potassium 3.3, this improved to 4.3 with potassium supplementation.  Chloride 102, CO2 29, BUN 5, creatinine 0.8 and blood sugar 112.  Calcium 8.8, total protein 6.3, albumin 3.6, AST 20, ALT 15, alk. phos. 61, total bilirubin 0.7.  TSH at 3.15.  Urinalysis clear.  HOSPITAL COURSE:  The patient was admitted  by Dr. Posey Rea in my absence and was started on IV fluids, IV Solu-Medrol and IV Tequin.  She was given hand-held nebulizer treatments with albuterol.  She was continued on her home meds as well.  As noted potassium was supplemented and she was given low-flow oxygen.  Her O2 saturations improved nicely.  With this regimen she was able to wean off the oxygen, ambulate prior to discharge without difficulty.  Her asthmatic bronchitis responded nicely to the Tequin and Solu-Medrol.   Chest congestion and wheezing diminished and lungs were clear.  We weaned the Solu-Medrol in favor of oral Medrol and changed the Tequin to the p.o. form prior to disease.  She as ambulating on the ward without difficulty. Pulmonary function studies were performed on the 29th and this showed signs compatible mild to moderate with restriction but no obstruction.  FVC was 1.54 L (62% of predicted), FEV1 1.24 L (62% of predicted), for an FEV1/FVC ratio of 80%.  Total lung capacity was 2.75 L (62% of predicted), and residual volume of 1.6 L (65% of predicted).  Diffusion capacity was normal.  These were interpreted showing evidence of mild to moderate restriction.  No significant airflow obstruction at this time.  The patient was considered to be MHB ready for discharge on May 11, 2000. She was given incentive spirometer to use at home to aid lung expansion.  MEDICATIONS AT DISCHARGE: 1. Tequin 400 mg p.o. q.d. for five more days until gone. 2. Medrol 16 mg p.o. q.d. for five days, then one-half tablet daily until    return. 3. Advair 500/50 one inhalation b.i.d. 4. Albuterol metered dose inhaler one or two inhalations q.4h. p.r.n.    wheezing. 5. Humibid L.A. two tablets p.o. b.i.d. with plenty of fluids.  She was instructed to continue her home medications including Flonase one or two sprays in each nostril daily, Zocor 10 mg p.o. q.h.s., Librium 10 mg p.o. t.i.d. p.r.n., amitriptyline 25 mg p.o. t.i.d., Inderal LA 80 one p.o. q.d. She also has Fioricet under the direction of Dr. Nolon Nations to use as needed for headaches.  CONDITION AT DISCHARGE:  Improved.  FOLLOWUP:  She is instructed to call the office for follow-up appointment in one week. DD:  05/11/00 TD:  05/12/00 Job: [id number]

## 2011-01-28 NOTE — Procedures (Signed)
[facility]  Patient:    Mary Alexander, Mary Alexander                     MRN: [id number] Proc. Date: 05/02/00 Adm. Date:  [id number] Attending:  Starr Sinclair CC:         Lonzo Cloud. Kriste Basque, M.D. [facility]                           Procedure Report  PROCEDURE:  Esophagogastroduodenoscopy with Savary dilation over guidewire and fluoroscopy.  ENDOSCOPIST:  Venita Lick. Pleas Koch., M.D.  REFERRING PHYSICIAN:  Lonzo Cloud. Kriste Basque, M.D.  INDICATIONS:  A 76 year old white female with intermittent right upper quadrant pain, GERD, and worsening solid food dysphagia.  PHYSICAL EXAMINATION:  CHEST:  Clear to auscultation and percussion.  CARDIAC:  Regular rate and rhythm without murmurs.  NEUROLOGIC:  Alert and oriented x 3.  ANESTHESIA:  Fentanyl 75 mcg IV, Versed 7 mg IV, and pharyngeal Cetacaine spray.  MONITORING:  Automated blood pressure monitor, pulse oximeter, and cardiac monitor.  Low-flow oxygen was given by nasal cannula throughout the procedure. The procedure was well tolerated with no immediate complications.  DESCRIPTION OF PROCEDURE:  After the nature of the procedure was discussed with the patient, including a discussion of its risks, benefits, and alternatives, she consented to proceed.  She was then comfortably sedated in the left lateral decubitus position in the fluoroscopy suite.  The Olympus video endoscope was inserted in the the posterior pharynx, and the esophagus was intubated under direct visualization.  The Z-line measured at 39 cm from the incisors.  There was a subtle ring-like stricture at the esophagogastric junction.  A small sliding hiatal hernia was noted in the gastric cardia on forward and retroflexed view.  Examination of the gastric body revealed no abnormalities.  Retroflexed view of the angularis, lesser curvature, and fundus was normal.  There was some minimal patchy erythema in the gastric antrum.  The gastric pylorus,  duodenal bulb, and descending duodenum all appeared unremarkable.  A soft-tip guidewire was placed into the gastric antrum under endoscopic control, and the endoscope was then removed over the guidewire under fluoroscopic control.  Sixteen millimeter and 17 mm Savary dilators were passed over the guidewire and across the stricture under fluoroscopic control.  There was a scant amount of blood noted on the second dilator.  The second dilator and guidewire were removed together from the patient.  IMPRESSION: 1. Small sliding hiatal hernia. 2. Subtle esophagogastric junction stricture. 3. Savary dilation over guidewire. 4. Fluoroscopy for dilation. 5. Mild nonerosive gastritis.  RECOMMENDATIONS: 1. No clear etiology for intermittent right upper quadrant pain. 2. Continue antireflux measures and Prilosec and assess response to dilation.    If her dysphagia returns, will proceed with esophageal manometry studies. 3. Return office visit with me in 12 months and as needed. 4. Ongoing follow-up with Dr. Alroy Dust. DD:  05/02/00 TD:  05/03/00 Job: [id number]

## 2011-01-28 NOTE — H&P (Signed)
[facility]  Patient:    Mary, Alexander                     MRN: [id number] Adm. Date:  [id number] Attending:  Armanda Heritage CC:         Lonzo Cloud. Kriste Basque, M.D. [facility]   History and Physical  DATE OF BIRTH:  May 27, 2027  CHIEF COMPLAINT:  Shortness of breath, cough, and nausea with vomiting.  HISTORY OF PRESENT ILLNESS:  The patient is a 77 year old female, who woke up congested on Saturday morning and developed cough productive of green sputum. She has been coughing all day long.  She was short of breath.  She had some nausea with chest pain from coughing.  She vomited once.  She presented today to the emergency room and was found to be hypoxic.  PAST MEDICAL HISTORY:  GERD, hyperlipidemia.  Denies a history of asthma. Headaches, hypertension, anxiety.  MEDICATIONS:  1. Premarin 0.625 mg daily.  2. Inderal LA 80 mg daily.  3. Chlordiazepoxide 10 mg three times a day.  4. Amitriptyline 25 mg three times a day.  5. Zocor 10 mg once a day.  6. Guiatex LA 600 mg twice twice a day.  7. Flonase 0.05 nasal spray two sprays twice a day.  8. Fioricet two four times a day as needed.  9. Prilosec 40 mg once a day. 10. Salicylate 750 mg as needed for back pain. 11. Centrum Silver. 12. Caltrate. 13. Osteo Bi-Flex. 14. Vitamin E.  ALLERGIES:  MORPHINE SULFATE causes nausea.  SOCIAL HISTORY:  She is married.  She quit smoking a long time ago.  FAMILY HISTORY:  Negative for asthma.  REVIEW OF SYSTEMS:  She felt okay prior to this recent illness.  She is a poor historian and overall unable to obtain good history.  PHYSICAL EXAMINATION:  Blood pressure 153/65, pulse 94, respirations 16, temperature 99.2 degrees, O2 saturation 90-91% on room air.  GENERAL APPEARANCE:  She is in no acute distress.  Slightly tachypneic.  HEENT:  Moist mucosa.  NECK:  Supple.  No thyromegaly or bruit.  LUNGS:  Bilateral rhonchi.  HEART:  S1 and S2 with slight tachycardia.   No enlargement to percussion.  ABDOMEN:  Obese, soft, and nontender.  EXTREMITIES:  Lower extremities without edema.  Calves nontender.  NEUROLOGIC:  Cranial nerves II-XII nonfocal.  Muscle strength normal.  She is alert, oriented, and cooperative.  LABORATORY DATA:  ABG on room air with pH of 7.45, pCO2 40.5, pO2 50.9, and O2 saturation 97% on room air.  White count 14.0, hemoglobin 13.2.  The chest x-ray is clear.  ASSESSMENT AND PLAN: 1. Hypoxemia likely due to acute tracheal bronchitis.  Obtain a spiral CT scan    of the chest to rule out pulmonary embolus. 2. Tracheal bronchitis, acute.  Started on IV antibiotics.  Nebulizer with    albuterol. 3. Bronchospasm.  Will use IV steroids.  Will hold Inderal. 4. Nausea and vomiting.  Phenergan p.r.n.  Cough syrup. 5. Deep venous thrombosis prophylaxis.  Will hold Premarin while in the    hospital.  Heparin 5000 units subcu b.i.d.  TED stockings. DD:  05/07/00 TD:  05/07/00 Job: [id number]

## 2011-01-28 NOTE — Cardiovascular Report (Signed)
   NAMEMELIYA, Mary Alexander                        ACCOUNT NO.:  [id number]   MEDICAL RECORD NO.:  [id number]                   PATIENT TYPE:  OIB   LOCATION:  2852                                 FACILITY:  [facility]   PHYSICIAN:  Salvadore Farber, MD [facility]            DATE OF BIRTH:  1927-07-13   DATE OF PROCEDURE:  05/02/2002  DATE OF DISCHARGE:                              CARDIAC CATHETERIZATION   PROCEDURES:  Left heart catheterization, left ventriculography, coronary  angiography.   INDICATIONS:  Chest pain, positive ETT.   COMPLICATIONS:  None.   DIAGNOSTIC TECHNIQUE:  Informed consent was obtained. Under 2% lidocaine  local anesthesia, a 6 French sheath was placed in the right femoral artery  using the modified Seldinger technique. JL4 and JR4 catheters were used for  angiography.  A 6 French pigtail catheter was advanced into the left  ventricle.  Pressures were measured. Ventriculography was performed by power  injection. Following the procedure, the sheath had been removed in the  holding room.   FINDINGS:  1. LV pressure:  174/12/25 after the administration of contrast.  2. Left ventriculogram not interpretable due to ectopy.  3. Left main:  Angiographically normal.  4. LAD:  The vessel is large and gives rise to a single moderate sized     diagonal branch.  The vessel is angiographically normal.  5. Circumflex:  The circumflex is moderate sized and gives rise to the     single obtuse marginal branch.  It is angiographically normal.  6. RCA: The right coronary artery is large and gives rise to both PDA and     large PLV. It is angiographically normal.  7. No aortic stenosis on pullback.    IMPRESSION/PLAN:  Angiographically normal coronary arteries. Will discharge  home four hours after sheath removal.  She will return to Dr. Kriste Basque for an  outpatient evaluation of her noncardiac chest pain.                                                     Salvadore Farber, MD  [facility]    WED/MEDQ  D:  05/02/2002  T:  05/05/2002  Job:  [id number]   cc:   Lonzo Cloud. Kriste Basque, M.D. [facility]   Luis Abed, M.D. [facility]

## 2011-01-28 NOTE — Op Note (Signed)
Mary Alexander, Mary Alexander              ACCOUNT NO.:  [id number]   MEDICAL RECORD NO.:  [id number]          PATIENT TYPE:  AMB   LOCATION:  [facility]                         FACILITY:  [facility]   PHYSICIAN:  Ronald L. Earlene Plater, M.D.  DATE OF BIRTH:  1927-01-12   DATE OF PROCEDURE:  10/17/2005  DATE OF DISCHARGE:                                 OPERATIVE REPORT   PREOPERATIVE DIAGNOSIS:  Bladder lesion.   POSTOPERATIVE DIAGNOSIS:  Bladder lesion.   OPERATION PERFORMED:  Cystourethroscopy, bladder washings with biopsy.   SURGEON:  Lucrezia Starch. Earlene Plater, M.D.   ANESTHESIA:  LMA.   ESTIMATED BLOOD LOSS:  Negligible.   TUBES:  None.   COMPLICATIONS:  None.   INDICATIONS FOR PROCEDURE:  Ms. Woodburn is a lovely 77 year old white  female who essentially presented with urgency, frequency and bladder pain  although she had a negative NMP22 and renal ultrasound was essentially okay.  On cystourethroscopy, she was found to have a patchy red area in the  posterior bladder wall.  After understanding risks, benefits and  alternatives and being cleared for surgery by Dr. Alroy Dust, she has  elected to proceed with cysto, bladder biopsy and bladder washings.   DESCRIPTION OF PROCEDURE:  The patient was placed in supine position.  After  proper LMA anesthesia, she was placed in dorsal lithotomy position and  prepped and draped with Betadine in sterile fashion.  Cystourethroscopy was  performed with a 22.5 French Olympus panendoscope utilizing the 12 and 70  degree lenses, the bladder was carefully inspected.  Efflux of clear urine  was noted through normally placed ureteral orifices bilaterally.  There was  approximately a 1.5 cm patchy red area in the posterior midline which was  velvety and slightly raised.  No other lesions were noted to be present.  The bladder was otherwise smooth walled and the urethra was normal.  Barbotage cytologies were obtained with normal saline and submitted for  cytology and two  biopsies were obtained from the patchy red area with a cold  cup biopsy forceps and submitted to pathology.  The base was cauterized with  Bugbee coagulation cautery, no other lesions were noted to be present.  The  bladder was drained, the panendoscope was removed and the patient was taken  to recovery room stable. Again, biopsies were submitted to pathology.     Ronald L. Earlene Plater, M.D.  Electronically Signed    RLD/MEDQ  D:  10/17/2005  T:  10/17/2005  Job:  [id number]

## 2011-02-17 ENCOUNTER — Other Ambulatory Visit: Payer: Self-pay | Admitting: *Deleted

## 2011-02-17 MED ORDER — ZOLPIDEM TARTRATE 10 MG PO TABS: 10.0000 mg | ORAL_TABLET | Freq: Every evening | ORAL | Status: DC | PRN

## 2011-02-18 ENCOUNTER — Encounter: Payer: Self-pay | Admitting: Pulmonary Disease

## 2011-02-21 ENCOUNTER — Other Ambulatory Visit (INDEPENDENT_AMBULATORY_CARE_PROVIDER_SITE_OTHER): Payer: Medicare Other

## 2011-02-21 ENCOUNTER — Encounter: Payer: Self-pay | Admitting: Pulmonary Disease

## 2011-02-21 ENCOUNTER — Ambulatory Visit (INDEPENDENT_AMBULATORY_CARE_PROVIDER_SITE_OTHER): Payer: Medicare Other | Admitting: Pulmonary Disease

## 2011-02-21 DIAGNOSIS — J309 Allergic rhinitis, unspecified: Secondary | ICD-10-CM

## 2011-02-21 DIAGNOSIS — M1990 Unspecified osteoarthritis, unspecified site: Secondary | ICD-10-CM

## 2011-02-21 DIAGNOSIS — R7309 Other abnormal glucose: Secondary | ICD-10-CM

## 2011-02-21 DIAGNOSIS — E7800 Pure hypercholesterolemia, unspecified: Secondary | ICD-10-CM

## 2011-02-21 DIAGNOSIS — F411 Generalized anxiety disorder: Secondary | ICD-10-CM

## 2011-02-21 DIAGNOSIS — K589 Irritable bowel syndrome without diarrhea: Secondary | ICD-10-CM

## 2011-02-21 DIAGNOSIS — D126 Benign neoplasm of colon, unspecified: Secondary | ICD-10-CM

## 2011-02-21 DIAGNOSIS — K219 Gastro-esophageal reflux disease without esophagitis: Secondary | ICD-10-CM

## 2011-02-21 DIAGNOSIS — I10 Essential (primary) hypertension: Secondary | ICD-10-CM

## 2011-02-21 DIAGNOSIS — E039 Hypothyroidism, unspecified: Secondary | ICD-10-CM

## 2011-02-21 DIAGNOSIS — J329 Chronic sinusitis, unspecified: Secondary | ICD-10-CM

## 2011-02-21 DIAGNOSIS — M545 Low back pain, unspecified: Secondary | ICD-10-CM

## 2011-02-21 DIAGNOSIS — R51 Headache: Secondary | ICD-10-CM

## 2011-02-21 LAB — HEMOGLOBIN A1C: Hgb A1c MFr Bld: 6.4 % (ref 4.6–6.5)

## 2011-02-21 LAB — BASIC METABOLIC PANEL
BUN: 19 mg/dL (ref 6–23)
GFR: 44.82 mL/min — ABNORMAL LOW (ref >60.00)
Glucose, Bld: 119 mg/dL — ABNORMAL HIGH (ref 70–99)
Potassium: 4.5 mEq/L (ref 3.5–5.1)

## 2011-02-21 LAB — CBC WITH DIFFERENTIAL/PLATELET
Eosinophils Relative: 1.9 % (ref 0.0–5.0)
HCT: 37.1 % (ref 36.0–46.0)
Lymphocytes Relative: 20 % (ref 12.0–46.0)
Monocytes Relative: 8.1 % (ref 3.0–12.0)
Neutrophils Relative %: 69.4 % (ref 43.0–77.0)
Platelets: 225 10*3/uL (ref 150.0–400.0)
WBC: 8.5 10*3/uL (ref 4.5–10.5)

## 2011-02-21 LAB — HEPATIC FUNCTION PANEL
ALT: 14 U/L (ref 0–35)
AST: 20 U/L (ref 0–37)
Albumin: 4.6 g/dL (ref 3.5–5.2)

## 2011-02-21 LAB — TSH: TSH: 5.46 u[IU]/mL (ref 0.35–5.50)

## 2011-02-21 LAB — LIPID PANEL
LDL Cholesterol: 123 mg/dL — ABNORMAL HIGH (ref 0–99)
VLDL: 23.6 mg/dL (ref 0.0–40.0)

## 2011-02-21 MED ORDER — BUTALBITAL-APAP-CAFFEINE 50-325-40 MG PO TABS: 1.0000 | ORAL_TABLET | Freq: Four times a day (QID) | ORAL | Status: DC | PRN

## 2011-02-21 MED ORDER — SERTRALINE HCL 100 MG PO TABS: 100.0000 mg | ORAL_TABLET | Freq: Every day | ORAL | Status: DC

## 2011-02-21 MED ORDER — ALPRAZOLAM 0.25 MG PO TABS: 0.2500 mg | ORAL_TABLET | Freq: Two times a day (BID) | ORAL | Status: DC | PRN

## 2011-02-21 MED ORDER — ZOLPIDEM TARTRATE 10 MG PO TABS: 10.0000 mg | ORAL_TABLET | Freq: Every evening | ORAL | Status: DC | PRN

## 2011-02-21 NOTE — Patient Instructions (Signed)
Today we updated your med list in EPIC...    We refilled your Ambien, Alprazolam, Zoloft, & Fioricet per request...  Today we did your follow up fasting blood work...    Please call the PHONE TREE in a few days for your results...    Dial [id number] & when prompted enter your patient number followed by the # symbol...    Your patient number is:  [id number]#  Stay as active as possible...  Try the Activia yogurt & the "Align" probiotic avail OTC to help your bowels...  Call for any questions...  Let's plan a routine follow up in 6 months, sooner if needed for any problems.Marland KitchenMarland Kitchen

## 2011-02-27 ENCOUNTER — Encounter: Payer: Self-pay | Admitting: Pulmonary Disease

## 2011-02-27 NOTE — Progress Notes (Signed)
Subjective:    Patient ID: Mary Alexander, female    DOB: [birth year], 77 y.o.   MRN: [id number]  HPI 77 y/o WF here for a follow up visit... she has mult somatic complaints & mult medical problems as below:  ~  February 22, 2010:  states she's been doing "bad" w/ "yeast" found by DrLomax & [id number]- now resolved... she had BMD 5/11 by GYN w/ TScores -2.0 in [facility], but OK in other sites & DrLomax only rec Caltrate + MVI... she's been taking Mobic/ Tramadol Prn for arthritis pain, and increased the Zoloft to 100mg /d w/ improvement... labs done 5/11> all essentially WNL.  ~  August 23, 2010:  She & husb moved in w/ daugh... c/o headaches & insomnia... tried Seroquel w/o benefit she says & wants Ambien (took one of her daughter's & it helped)... also wants refill Fioricet for HAs & MMW for throat...  ~  February 21, 2011:  81mo ROV & she reports "same old thing" meaning that they are living w/ daugh since her husb is ill, getting worse, needs lots of help 7 stressful for her;  Requests Ambien refill to help her rest + her Fioricet for HAs, Alprazolam for nerves, and Zoloft for depression...  Fasting labs showed LDL 123, FBS 119, A1c 6.4, Hg 12.6, TSH 5.46> SEE PROB LIST below...   Problem List:  ALLERGIC RHINITIS (ICD-477.9) - on ALLEGRA 180mg /d just Prn now... SINUSITIS (ICD-473.9) ASTHMATIC BRONCHITIS, ACUTE (ICD-466.0) - no recent exas etc...   HYPERTENSION (ICD-401.9) - controlled on ATENOLOL 50mg /d, and ASA 81mg /d... BP=124/64 here today & similar at home... denies HA, fatigue, visual changes, CP, dizziness, syncope, dyspnea, edema, etc... ~  baseline EKG shows NSR, NSSTTWA... ~  NuclearStressTest 8/03 showed ? mild ischemia apical/ lat wall, EF=69%... referred to [facility]- ~  cath 8/03 showed normal coronaries...  CHEST PAIN, ATYPICAL (ICD-786.59) & PALPITATIONS (ICD-785.1) - eval 10/09 by Walker Kehr w/ repeat Myoview= neg, no infarct or ischemia, EF= 81%...  she denies recent  symptoms.  HYPERCHOLESTEROLEMIA (ICD-272.0) - on ZOCOR 40mg /d & LOPID 600Bid...  ~  FLP 8/08 showed TChol 167, TG 127, HDL 38, LDL 103... ~  FLP 2/09 showed TChol 208, TG 228, HDL 39, LDL 118... continue same med, better diet+ exercise. ~  FLP 8/09 showed TChol 205, TG 187, HDL 40, LDL 124... rec to take meds regularly & repeat. ~  FLP 12/09 on Simva20+LopidBid  showed TChol 218, TG 166, HDL 37, LDL 134... rec> incr Simva40. ~  FLP 6/10 on Simva40+LopidBid showed TChol 187, TG, 129, HDL 48, LDL 113 ~  FLP 12/10 on same showed TChol 182, TG 177, HDL 43, LDL 104... contin Rx, better diet. ~  FLP 6/11 showed TChol 184, TG 158, HDL 41, LDL 111 ~  FLP 12/11 showed TChol 221, TG 156, HDL 39, LDL 144... needs same med, much better diet! ~  FLP 6/12 showed TChol 190, TG 118, HDL 44, LDL 123... Improved   DIABETES MELLITUS, BORDERLINE (ICD-790.29) - on diet alone... ~  labs prev w/ FBS betw 125 - 145... ~  labs 8/09 showed BS= 125, HgA1c= 6.6 on diet therapy... ~  labs 12/09 showed BS= 112, A1c= 6.3.Marland Kitchen. rec> contin diet rx. ~  labs 12/10 showed BS= 114, A1c= 6.3.Marland KitchenMarland Kitchen continue diet rx. ~  labs 12/11 showed BS= 102, A1c= 6.1 ~  Labs 6/12 showed BS= 119, A1c= 6.4.Marland KitchenMarland Kitchen Continue diet efforts.  HYPOTHYROIDISM (ICD-244.9) - on SYNTHROID 50mcg/d... ~  TSH 8/08 was 7.04 on Synthroid 58mcg/d,  therefore increased to 59mcg/d... ~  TSH 2/09 on 62mcg/d was 0.34... keep same... ~  labs 8/09 showed TSH= 4.02 ~  labs 12/09 showed TSH= 4.23 ~  labs 12/10 on Levothy88 showed TSH= 1.24 ~  labs 5/11 on Levothy88 showed TSH= 2.37 ~  Labs 6/12 on Levothy88 showed TSH= 5.46... Reminded to take it every day.  GERD (ICD-530.81) - Protonix changed to DEXILANT 60mg /d & improved per pt...  ~  last EGD was 11/05 showing gastritis... rec for PPI Bid indefinitely... ~  8/09: c/o incr nocturnal reflux symptoms- rec> change Prevacid to Protonix, add Reglan 10mg Qhs, elevate HOB, avoid caffeine, etc... ~  10/10: saw DrStark w/ incr  GERD- changed to DEXILANT 60mg /d which helped- planned EGD (not done).  COLONIC POLYPS (ICD-211.3) & IRRITABLE BOWEL SYNDROME (ICD-564.1) - she requests LIBRAX but doesn't want the generic "yellow" caps, she wants the non-generic "blue" caps... ~  last colonoscopy was 8/06 by DrStark showing normal except for hems... f/u planned 17yrs. ~  sched for colonoscopy 11/10 (due to blood in stool) but she refused due to prep required...  DEGENERATIVE JOINT DISEASE (ICD-715.90) & BACK PAIN, LUMBAR (ICD-724.2) - she is off the  Etodolac & Flexeril... off prev Mobic & Tramadol... she takes SALSALATE 750mg  Prn from DrRendall + calcium and vits...  Dr Priscille Kluver referrred her to [facility] for shots in back- she reports improvement.  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  ANXIETY (ICD-300.00) - she was taking both Librium10 & Xanax 0.25mg ... she prefers the ALPRAZOLAM 0.25mg  Tid Prn... now on ZOLOFT 100mg /d & AMBIEN 10mg  Qhs for sleep.  Health Maintenance - GYN = DrLomax...  Urology = DrRDavis...  she had Shingles vaccine 6/09 at [facility]... she had PNEUMOVAX 2/09 here... she had the seasonal flu shot 10/09, and the 2010 Flu vaccine...   Past Surgical History  Procedure Date  . Vesicovaginal fistula closure w/ tah   . Polypectomy   . Cataract extraction     Outpatient Encounter Prescriptions as of 02/21/2011  Medication Sig Dispense Refill  . ALPRAZolam (XANAX) 0.25 MG tablet Take 1 tablet (0.25 mg total) by mouth 2 (two) times daily as needed.  60 tablet  5  . atenolol (TENORMIN) 50 MG tablet Take 50 mg by mouth daily.        . butalbital-acetaminophen-caffeine (FIORICET) 50-325-40 MG per tablet Take 1 tablet by mouth every 6 (six) hours as needed.  14 tablet  5  . Calcium Carbonate-Vitamin D (CALTRATE 600+D) 600-400 MG-UNIT per tablet Take 1 tablet by mouth daily.        . clidinium-chlordiazePOXIDE (LIBRAX) 2.5-5 MG per capsule Take 1  capsule by mouth 3 (three) times daily as needed.        . Clobetasol Propionate (CLOBEX) 0.05 % lotion Apply topically 2 (two) times daily.        Marland Kitchen dexlansoprazole (DEXILANT) 60 MG capsule Take 60 mg by mouth daily. 30 minutes before first meal of the day       . gemfibrozil (LOPID) 600 MG tablet Take 600 mg by mouth 2 (two) times daily before a meal.        . levothyroxine (SYNTHROID, LEVOTHROID) 75 MCG tablet Take 75 mcg by mouth daily.        . Multiple Vitamin (MULTIVITAMIN) capsule Take 1 capsule by mouth daily.        . nitroGLYCERIN (NITROSTAT) 0.4 MG SL tablet Place 1 tablet (0.4 mg  total) under the tongue every 5 (five) minutes as needed for chest pain.  100 tablet  3  . salsalate (DISALCID) 750 MG tablet Take 750 mg by mouth 2 (two) times daily. As directed by Dr. Priscille Kluver       . sertraline (ZOLOFT) 100 MG tablet Take 1 tablet (100 mg total) by mouth daily.  30 tablet  11  . simvastatin (ZOCOR) 40 MG tablet Take 40 mg by mouth at bedtime.        Marland Kitchen zolpidem (AMBIEN) 10 MG tablet Take 1 tablet (10 mg total) by mouth at bedtime as needed for sleep.  30 tablet  5  . Methylcellulose, Laxative, (CITRUCEL) 500 MG TABS Take 1 tablet by mouth 2 (two) times daily.          Allergies  Allergen Reactions  . Penicillins     REACTION: yeast infection    Review of Systems        See HPI - all other systems neg except as noted...  The patient complains of dyspnea on exertion, headaches, severe indigestion/heartburn, muscle weakness, difficulty walking, and depression.  The patient denies anorexia, fever, weight loss, weight gain, vision loss, decreased hearing, hoarseness, chest pain, syncope, peripheral edema, prolonged cough, hemoptysis, abdominal pain, melena, hematochezia, hematuria, incontinence, suspicious skin lesions, transient blindness, unusual weight change, abnormal bleeding, enlarged lymph nodes, and angioedema.    Objective:   Physical Exam     WD, WN, 77 y/o WF in  [place]... GENERAL:  Alert & oriented; pleasant & cooperative... HEENT:  [place]ks/AT, EOM-wnl, PERRLA, EACs-clear, TMs-wnl, NOSE-clear, THROAT-clear & wnl. NECK:  Supple w/ fairROM; no JVD; normal carotid impulses w/o bruits; no thyromegaly or nodules palpated; no lymphadenopathy. CHEST:  Clear to P & A; without wheezes/ rales/ or rhonchi. HEART:  Regular Rhythm; without murmurs/ rubs/ or gallops. ABDOMEN:  Soft & nontender; normal bowel sounds; no organomegaly or masses detected. EXT: without deformities, mildarthritic changes; no varicose veins/ venous insuffic/ or edema. NEURO:  CN's intact; no focal neuro deficits... DERM:  No lesions noted; no rash etc...   Assessment & Plan:   HBP>  Controlled on Atenolol, continue same...  CHOL>  On Simva40 + Gemfib Bid; FLP is improved & she is encouraged to continue diet + med Rx.  DM>  Stable on diet alone & we reviewed low carb restriction etc...  HYPOTHY>  Stable on Levothy88 & reminded to take it every day...  GI>  GERD/ IBS/ Polyps>  Continue Dexilant, Librax, etc...  DJD/ LBP>  She continues on disalcid from DrRendall...  HA/ Anxiety/ Insomnia/ etc>  She requests refills for her chronic meds including: Fioricet, Alprazolam, Zoloft, Ambien.Marland KitchenMarland Kitchen

## 2011-04-02 ENCOUNTER — Other Ambulatory Visit: Payer: Self-pay | Admitting: Pulmonary Disease

## 2011-05-31 ENCOUNTER — Emergency Department (HOSPITAL_COMMUNITY): Payer: Medicare Other

## 2011-05-31 ENCOUNTER — Emergency Department (HOSPITAL_COMMUNITY)
Admission: EM | Admit: 2011-05-31 | Discharge: 2011-05-31 | Disposition: A | Discharge status: Home / Self Care | Payer: Medicare Other | Attending: Emergency Medicine | Admitting: Emergency Medicine

## 2011-05-31 ENCOUNTER — Telehealth: Payer: Self-pay | Admitting: Pulmonary Disease

## 2011-05-31 DIAGNOSIS — R5381 Other malaise: Secondary | ICD-10-CM | POA: Insufficient documentation

## 2011-05-31 DIAGNOSIS — R262 Difficulty in walking, not elsewhere classified: Secondary | ICD-10-CM | POA: Insufficient documentation

## 2011-05-31 DIAGNOSIS — R110 Nausea: Secondary | ICD-10-CM | POA: Insufficient documentation

## 2011-05-31 DIAGNOSIS — M549 Dorsalgia, unspecified: Secondary | ICD-10-CM | POA: Insufficient documentation

## 2011-05-31 LAB — CBC
HCT: 35.5 % — ABNORMAL LOW (ref 36.0–46.0)
Hemoglobin: 11.6 g/dL — ABNORMAL LOW (ref 12.0–15.0)
MCH: 29.3 pg (ref 26.0–34.0)
MCHC: 32.7 g/dL (ref 30.0–36.0)
MCV: 89.6 fL (ref 78.0–100.0)
RDW: 13.1 % (ref 11.5–15.5)

## 2011-05-31 LAB — COMPREHENSIVE METABOLIC PANEL
Albumin: 3.8 g/dL (ref 3.5–5.2)
BUN: 17 mg/dL (ref 6–23)
Creatinine, Ser: 1.01 mg/dL (ref 0.50–1.10)
GFR calc Af Amer: >60 mL/min (ref >60)
Glucose, Bld: 134 mg/dL — ABNORMAL HIGH (ref 70–99)
Total Protein: 6.8 g/dL (ref 6.0–8.3)

## 2011-05-31 LAB — URINALYSIS, ROUTINE W REFLEX MICROSCOPIC
Bilirubin Urine: NEGATIVE
Hgb urine dipstick: NEGATIVE
Nitrite: NEGATIVE
Protein, ur: NEGATIVE mg/dL
Specific Gravity, Urine: 1.016 (ref 1.005–1.030)
Urobilinogen, UA: 0.2 mg/dL (ref 0.0–1.0)

## 2011-05-31 LAB — LIPASE, BLOOD: Lipase: 47 U/L (ref 11–59)

## 2011-05-31 LAB — DIFFERENTIAL
Basophils Relative: 3 % — ABNORMAL HIGH (ref 0–1)
Eosinophils Relative: 3 % (ref 0–5)
Lymphs Abs: 1.8 10*3/uL (ref 0.7–4.0)
Monocytes Absolute: 0.5 10*3/uL (ref 0.1–1.0)
Neutro Abs: 2.3 10*3/uL (ref 1.7–7.7)

## 2011-05-31 LAB — TROPONIN I: Troponin I: <0.3 ng/mL (ref <0.30)

## 2011-05-31 NOTE — Telephone Encounter (Signed)
Will forward to SN as an FYI so he is aware pt went to [facility] ED for strok like symptoms. Please advise Dr. Kriste Basque. Thanks  Carver Fila, CMA

## 2011-06-03 ENCOUNTER — Telehealth: Payer: Self-pay | Admitting: Pulmonary Disease

## 2011-06-03 DIAGNOSIS — R531 Weakness: Secondary | ICD-10-CM

## 2011-06-03 DIAGNOSIS — R269 Unspecified abnormalities of gait and mobility: Secondary | ICD-10-CM

## 2011-06-03 NOTE — Telephone Encounter (Signed)
[id number] 

## 2011-06-06 NOTE — Telephone Encounter (Signed)
Spoke with pt's daughter Glennon Mac. She states that she had to take pt to ED at [facility] last wk-9/18 after she woke up and could not walk. She states that all of her tests came back neg and they were questioning whether or not she had a TIA or not. She states that pt still has great diff with walking and so has had to use a walker and this is not like her. She was seen by ortho also and was advised that there have been no changes in her back and so ruled out this being an issue. Daughter wonders if she needs eval by neuro. Please advise, thanks!

## 2011-06-06 NOTE — Telephone Encounter (Signed)
lmomtcb  

## 2011-06-07 NOTE — Telephone Encounter (Signed)
Called and spoke with Mary Alexander--pts daughter and she is aware that we will set pt up with neuro for appt for weakness and abnormal gait. She is aware that this may take some time and we will call with the appt.

## 2011-06-14 ENCOUNTER — Ambulatory Visit (INDEPENDENT_AMBULATORY_CARE_PROVIDER_SITE_OTHER): Payer: Medicare Other | Admitting: Neurology

## 2011-06-14 ENCOUNTER — Encounter: Payer: Self-pay | Admitting: Neurology

## 2011-06-14 VITALS — BP 132/68 | HR 64 | Wt 122.0 lb

## 2011-06-14 DIAGNOSIS — R2689 Other abnormalities of gait and mobility: Secondary | ICD-10-CM

## 2011-06-14 DIAGNOSIS — R29818 Other symptoms and signs involving the nervous system: Secondary | ICD-10-CM

## 2011-06-14 NOTE — Progress Notes (Signed)
Dear Dr. Kriste Basque,  Thank you for having me see Mary Alexander in consultation for her problem walking.  As you know she is an 77 year old woman with a history of hyperlipidemia, hypertension and degenerative disease of the spine who with complaints of sudden difficulty walking on September 18th.  She felt like she couldn't feel her legs.  She denies weakness in the arms, dizziness or vertigo, dysphagia or dysarthria or changes in her vision.  She was seen initially in the ED and was sent home without a diagnosis.  A CT head was unremarkable.  She saw her orthopedist who follows her degenerative back disease and he schedule an EMG/NCS.  She has had gradual improvement in her symptoms but still needs assistance to walk.   Past Medical History  Diagnosis Date  . Allergic rhinitis, cause unspecified   . Unspecified sinusitis (chronic)   . Acute bronchitis   . Other chest pain   . Palpitations   . Pure hypercholesterolemia   . Unspecified hypothyroidism   . Esophageal reflux   . Benign neoplasm of colon   . Irritable bowel syndrome   . External hemorrhoids without mention of complication   . Osteoarthrosis, unspecified whether generalized or localized, unspecified site   . Lumbago   . Headache   . Anxiety state, unspecified    Past Surgical History  Procedure Date  . Vesicovaginal fistula closure w/ tah   . Polypectomy   . Cataract extraction    History   Social History  . Marital Status: Married    Spouse Name: Jerilynn Som    Number of Children: 2  . Years of Education: N/A   Occupational History  .     Social History Main Topics  . Smoking status: Never Smoker   . Smokeless tobacco: Never Used  . Alcohol Use: No  . Drug Use: No  . Sexually Active: None   Other Topics Concern  . None   Social History Narrative  . None   Family History  Problem Relation Age of Onset  . Heart disease Mother     Father  . COPD Sister   . Stroke Sister   . Breast cancer Sister   .  Prostate cancer Brother    Exam: Filed Vitals:   06/14/11 1529  BP: 132/68  Pulse: 64  Weight: 122 lb (55.339 kg)   Gen:  thin appearing older woman  Cardiovascular: The patient has a regular rate and rhythm and no carotid bruits.  Fundoscopy:  Disks are flat. Vessel caliber within normal limits.  Mental status:   The patient is oriented to person, place and time. Recent and remote memory are intact. Attention span and concentration are normal. Language including repetition, naming, following commands are intact. Fund of knowledge of current and historical events, as well as vocabulary are normal.  Cranial Nerves: Pupils are equally round and reactive to light. Visual fields full to confrontation. Extraocular movements are intact without nystagmus. No hypometric saccades Facial sensation and muscles of mastication are intact. Muscles of facial expression are symmetric. Hearing intact to bilateral finger rub. Tongue protrusion, uvula, palate midline.  Shoulder shrug intact  Motor:  The patient has normal bulk and tone, no pronator drift and 5/5 strength bilaterally.  There are no adventitious movements.  Reflexes:  Are 3+ bilaterally in both the upper and lower extremities.  Toes down.    Coordination:  Normal finger to nose.  Heal to shin impaired when asking patient to jump a  fixed distance down her shin.  Patient has obvious titubation when she sits.  Sensation is decreased to temperature and vibration in her lower extremities.  Also to position sense  Gait and  station are ataxic.  Cannot stand with her feet together eyes open.  Romberg cannot be tested.  CT Head reviewed and remarkable for bilateral calcifications of the BG.  No obvious infarction.   Impression:  Likely ischemic stroke involving either midline cerebellum or parasagittal parietal lobes.  Recs: 1.  MRI Brain with MRA head and neck. 2.  Aspirin 325 daily. 3.  Home PT and OT. 4.  Will need an echocardiogram  if one not done recently and we have confirmed a stroke.  I will see the patient in follow up in 6 weeks.  Thank you for having [place] see this patient in consultation.  Feel free to contact me with any questions.  Lupita Raider Modesto Charon, MD [facility], [place] [address] [place], [place] [address] Phone: [phone] Fax: [phone].

## 2011-06-14 NOTE — Patient Instructions (Signed)
Your MRI and MRA's are scheduled for Thursday, October 11th at 11:00am.  Please arrive to [facility] by 10:45am.  First floor admitting.    Your follow up appointment with Dr. Modesto Charon is on Monday, November 26th at 11:30am.  We will be calling you regarding home health.

## 2011-06-20 ENCOUNTER — Telehealth: Payer: Self-pay | Admitting: Pulmonary Disease

## 2011-06-20 NOTE — Telephone Encounter (Signed)
I spoke with Raynelle Fanning from [facility] and she states she needed to inform Dr. Kriste Basque that patient has medication that has level 1 interactions. She states it is pt's lopid and simvastatin. I advised her according to EMR pt has been on this for a while. She states I still needed to make SN aware. I advised her will do and that he is out of the office this week but will forward message to him. Please advise Dr. Kriste Basque, thanks  Carver Fila, CMA

## 2011-06-23 ENCOUNTER — Ambulatory Visit (HOSPITAL_COMMUNITY): Admission: RE | Admit: 2011-06-23 | Payer: Medicare Other | Source: Ambulatory Visit

## 2011-06-23 ENCOUNTER — Ambulatory Visit (HOSPITAL_COMMUNITY): Payer: Medicare Other

## 2011-06-23 ENCOUNTER — Ambulatory Visit (HOSPITAL_COMMUNITY)
Admission: RE | Admit: 2011-06-23 | Discharge: 2011-06-23 | Disposition: A | Discharge status: Home / Self Care | Payer: Medicare Other | Source: Ambulatory Visit | Attending: Neurology | Admitting: Neurology

## 2011-06-23 DIAGNOSIS — R2689 Other abnormalities of gait and mobility: Secondary | ICD-10-CM

## 2011-06-23 DIAGNOSIS — I672 Cerebral atherosclerosis: Secondary | ICD-10-CM | POA: Insufficient documentation

## 2011-06-23 DIAGNOSIS — I6529 Occlusion and stenosis of unspecified carotid artery: Secondary | ICD-10-CM | POA: Insufficient documentation

## 2011-06-23 MED ORDER — GADOBENATE DIMEGLUMINE 529 MG/ML IV SOLN
13.0000 mL | Freq: Once | INTRAVENOUS | Status: AC
  Administered 2011-06-23: 13 mL via INTRAVENOUS

## 2011-06-27 ENCOUNTER — Other Ambulatory Visit: Payer: Self-pay | Admitting: Pulmonary Disease

## 2011-06-27 ENCOUNTER — Encounter: Payer: Self-pay | Admitting: Pulmonary Disease

## 2011-06-27 DIAGNOSIS — R0789 Other chest pain: Secondary | ICD-10-CM

## 2011-06-27 DIAGNOSIS — R002 Palpitations: Secondary | ICD-10-CM

## 2011-06-27 NOTE — Telephone Encounter (Signed)
Per SN---we are carefully monitoring the pts medications and she is stable--thanks for the info and we will cont to monitor for side effects.  Ask her to call if she is having any problems with the combo.  thanks

## 2011-06-27 NOTE — Telephone Encounter (Signed)
I spoke with Mary Alexander and she is aware of SN response. She verbalized understanding and states she will also inform pt of this as well

## 2011-06-29 ENCOUNTER — Ambulatory Visit (HOSPITAL_COMMUNITY): Payer: Medicare Other | Attending: Pulmonary Disease | Admitting: Radiology

## 2011-06-29 DIAGNOSIS — R0789 Other chest pain: Secondary | ICD-10-CM

## 2011-06-29 DIAGNOSIS — G459 Transient cerebral ischemic attack, unspecified: Secondary | ICD-10-CM | POA: Insufficient documentation

## 2011-06-29 DIAGNOSIS — I059 Rheumatic mitral valve disease, unspecified: Secondary | ICD-10-CM | POA: Insufficient documentation

## 2011-06-29 DIAGNOSIS — R079 Chest pain, unspecified: Secondary | ICD-10-CM | POA: Insufficient documentation

## 2011-06-29 DIAGNOSIS — E785 Hyperlipidemia, unspecified: Secondary | ICD-10-CM | POA: Insufficient documentation

## 2011-06-29 DIAGNOSIS — I10 Essential (primary) hypertension: Secondary | ICD-10-CM | POA: Insufficient documentation

## 2011-06-29 DIAGNOSIS — R002 Palpitations: Secondary | ICD-10-CM

## 2011-07-01 ENCOUNTER — Telehealth: Payer: Self-pay | Admitting: Pulmonary Disease

## 2011-07-01 NOTE — Telephone Encounter (Signed)
Pt is requesting results of echo done.  Printed out for SN to review.  thanks

## 2011-07-01 NOTE — Telephone Encounter (Signed)
Per SN---normal LV function--normal heart muscle function, negative bubble study which means no holes in the heart. Left atrium slightly dialated.  lmomtcb for pt to get results.

## 2011-07-04 NOTE — Telephone Encounter (Signed)
Called, spoke with pt's daughter, Tamela Oddi.  I informed her of echo results as stated below per SN.  [name] verbalized understanding of this and voiced no further questions/concerns at this time.

## 2011-07-12 ENCOUNTER — Other Ambulatory Visit: Payer: Self-pay | Admitting: Pulmonary Disease

## 2011-08-03 ENCOUNTER — Other Ambulatory Visit: Payer: Self-pay | Admitting: *Deleted

## 2011-08-03 MED ORDER — FIRST-DUKES MOUTHWASH MT SUSP: OROMUCOSAL | Status: DC

## 2011-08-08 ENCOUNTER — Ambulatory Visit: Payer: Medicare Other | Admitting: Neurology

## 2011-08-09 ENCOUNTER — Other Ambulatory Visit: Payer: Self-pay | Admitting: Pulmonary Disease

## 2011-08-09 ENCOUNTER — Other Ambulatory Visit: Payer: Self-pay | Admitting: Adult Health

## 2011-08-25 ENCOUNTER — Other Ambulatory Visit: Payer: Self-pay | Admitting: Pulmonary Disease

## 2011-08-25 ENCOUNTER — Encounter: Payer: Self-pay | Admitting: Pulmonary Disease

## 2011-08-25 ENCOUNTER — Other Ambulatory Visit (INDEPENDENT_AMBULATORY_CARE_PROVIDER_SITE_OTHER): Payer: Medicare Other

## 2011-08-25 ENCOUNTER — Ambulatory Visit (INDEPENDENT_AMBULATORY_CARE_PROVIDER_SITE_OTHER): Payer: Medicare Other | Admitting: Pulmonary Disease

## 2011-08-25 DIAGNOSIS — E039 Hypothyroidism, unspecified: Secondary | ICD-10-CM

## 2011-08-25 DIAGNOSIS — G459 Transient cerebral ischemic attack, unspecified: Secondary | ICD-10-CM

## 2011-08-25 DIAGNOSIS — I10 Essential (primary) hypertension: Secondary | ICD-10-CM

## 2011-08-25 DIAGNOSIS — R7309 Other abnormal glucose: Secondary | ICD-10-CM

## 2011-08-25 DIAGNOSIS — F411 Generalized anxiety disorder: Secondary | ICD-10-CM

## 2011-08-25 DIAGNOSIS — K589 Irritable bowel syndrome without diarrhea: Secondary | ICD-10-CM

## 2011-08-25 DIAGNOSIS — M1990 Unspecified osteoarthritis, unspecified site: Secondary | ICD-10-CM

## 2011-08-25 DIAGNOSIS — K219 Gastro-esophageal reflux disease without esophagitis: Secondary | ICD-10-CM

## 2011-08-25 DIAGNOSIS — E7800 Pure hypercholesterolemia, unspecified: Secondary | ICD-10-CM

## 2011-08-25 DIAGNOSIS — M545 Low back pain, unspecified: Secondary | ICD-10-CM

## 2011-08-25 DIAGNOSIS — D126 Benign neoplasm of colon, unspecified: Secondary | ICD-10-CM

## 2011-08-25 DIAGNOSIS — I679 Cerebrovascular disease, unspecified: Secondary | ICD-10-CM

## 2011-08-25 LAB — LIPID PANEL
Cholesterol: 188 mg/dL (ref 0–200)
Total CHOL/HDL Ratio: 4
Triglycerides: 251 mg/dL — ABNORMAL HIGH (ref 0.0–149.0)

## 2011-08-25 LAB — CBC WITH DIFFERENTIAL/PLATELET
Basophils Absolute: 0.1 10*3/uL (ref 0.0–0.1)
Eosinophils Absolute: 0.1 10*3/uL (ref 0.0–0.7)
Lymphocytes Relative: 32.3 % (ref 12.0–46.0)
MCHC: 34.3 g/dL (ref 30.0–36.0)
Monocytes Relative: 8.7 % (ref 3.0–12.0)
Neutrophils Relative %: 55.8 % (ref 43.0–77.0)
Platelets: 251 10*3/uL (ref 150.0–400.0)
RDW: 14.1 % (ref 11.5–14.6)

## 2011-08-25 LAB — BASIC METABOLIC PANEL
CO2: 27 mEq/L (ref 19–32)
Calcium: 9.7 mg/dL (ref 8.4–10.5)
Chloride: 104 mEq/L (ref 96–112)
Glucose, Bld: 112 mg/dL — ABNORMAL HIGH (ref 70–99)
Sodium: 141 mEq/L (ref 135–145)

## 2011-08-25 LAB — HEPATIC FUNCTION PANEL
ALT: 9 U/L (ref 0–35)
Albumin: 4.2 g/dL (ref 3.5–5.2)
Alkaline Phosphatase: 58 U/L (ref 39–117)
Bilirubin, Direct: 0.1 mg/dL (ref 0.0–0.3)
Total Protein: 7.3 g/dL (ref 6.0–8.3)

## 2011-08-25 LAB — TSH: TSH: 5.81 u[IU]/mL — ABNORMAL HIGH (ref 0.35–5.50)

## 2011-08-25 LAB — HEMOGLOBIN A1C: Hgb A1c MFr Bld: 6.2 % (ref 4.6–6.5)

## 2011-08-25 LAB — LDL CHOLESTEROL, DIRECT: Direct LDL: 95.3 mg/dL

## 2011-08-25 MED ORDER — FLUCONAZOLE 100 MG PO TABS: ORAL_TABLET | ORAL | Status: AC

## 2011-08-25 NOTE — Patient Instructions (Signed)
Today we updated your med list in our EPIC system...    Continue your current medications the same, including your Aspirin daily...    We decided to treat any yeast in your system w/ a course of DIFLUCAN to elim this problem...  Today we did your follow up fasting blood work...    Please call the PHONE TREE in a few days for your results...    Dial [id number] & when prompted enter your patient number followed by the # symbol...    Your patient number is:  [id number]#  Call for any questions...  Let's plan another routine follow up visit in 6 months, sooner if needed for problems.Marland KitchenMarland Kitchen

## 2011-08-30 ENCOUNTER — Other Ambulatory Visit: Payer: Self-pay | Admitting: Pulmonary Disease

## 2011-09-07 ENCOUNTER — Other Ambulatory Visit: Payer: Self-pay | Admitting: Pulmonary Disease

## 2011-09-11 ENCOUNTER — Encounter: Payer: Self-pay | Admitting: Pulmonary Disease

## 2011-09-11 NOTE — Progress Notes (Signed)
Subjective:    Patient ID: Mary Alexander, female    DOB: 26-Sep-1926, 77 y.o.   MRN: [id number]  HPI 77 y/o WF here for a follow up visit... she has mult somatic complaints & mult medical problems as below:  ~  February 22, 2010:  states she's been doing "bad" w/ "yeast" found by DrLomax & [id number]- now resolved... she had BMD 5/11 by GYN w/ TScores -2.0 in [facility], but OK in other sites & DrLomax only rec Caltrate + MVI... she's been taking Mobic/ Tramadol Prn for arthritis pain, and increased the Zoloft to 100mg /d w/ improvement... labs done 5/11> all essentially WNL.  ~  August 23, 2010:  She & husb moved in w/ daugh... c/o headaches & insomnia... tried Seroquel w/o benefit she says & wants Ambien (took one of her daughter's & it helped)... also wants refill Fioricet for HAs & MMW for throat...  ~  February 21, 2011:  49mo ROV & she reports "same old thing" meaning that they are living w/ daugh since her husb is ill, getting worse, needs lots of help & stressful for her;  Requests Ambien refill to help her rest + her Fioricet for HAs, Alprazolam for nerves, and Zoloft for depression...  Fasting labs showed LDL 123, FBS 119, A1c 6.4, Hg 12.6, TSH 5.46> SEE PROB LIST below...  ~  August 25, 2011:  49mo ROV & she went to the ER 9/12 w/ CC weakness, ?back pain- ?felt like she couldn't walk although exam was neg & non-focal; CTBrain w/ atrophy, sm vessel dis, no acute changes; CXR- clear; EKG- SBrady 58/min, LAD, 1st deg AVB, NSSTTWA; LABS- normal, BS=134, Hg=11.6, UA- clear...  Daughter is a Engineer, civil (consulting) & took her to [facility] [employer] (we don't have note) who said it wasn't her back;  Then called requesting Neuro eval to r/o stroke> she saw DrWong 10/12 & his exam revealed some ataxia & decr sensation distally; MRI showed atrophy & sm vessel dis, no intracranial lesions (but by DrWong's review he felt she had a splenium infarction); MRA showed intracranial atherosclerotic changes in branch vessels esp in middle  cerebral & post cerebral distrib; MRA neck w/ 58% stenosis prox left ICA, & ectatic vertebrals w/ some narrowing;  He rec ASA 325mg /d + HomePT... He also requested 2DEcho done 10/12 w/ norm LVF/ wall motion/ & EF=55-60%, mod LAdil (45mm), neg bubble study...  They report that the PT has helped, she is ambulating w/ a rolling walker, she is taking the 325mg ASA...  HBP> on Aten50; BP= 126/60 w/o CP, palpit, SOB (but too sedentary), edema, etc...  Hyperlipid> on Simva40 & Lopid600Bid; FLP showed TChol 188, TG 251, HDL 42, LDL 95==> needs better low fat diet...  DM> on diet alone & labs showed FBS=112, A1c=6.2  Hypothyroid> on Synthroid75; TSH= 5.81 & reminded to take it everyday, if trend is up we will tweek dose later...  GERD/ IBS/ Polyps> on Dexilant60 & Librax prn; c/o gas & rec to add Simethacone preps prn- eg. Mylicon, Phazyme, etc...  GYN> she is c/o yeast infection & she is referred to her GYN for exam; they request Diflucan Rx (daugh is a Engineer, civil (consulting))- ok...  DJD/ LBP> on Disalcid750Bid per DrRendall; she is c/o neck pain and knee pain & DrRendall has done XRays & scans they tell me "it's normal wear & tear", using icey hot patches & spray as needed  HAs> on Fioricet prn & she insists on the "Teva blues" from her pharm...  Anxiety & Depression>  on Zoloft100, Xanax0.25prn, Ambien10prn;     Problem List:  ALLERGIC RHINITIS (ICD-477.9) - on ALLEGRA 180mg /d just Prn now... SINUSITIS (ICD-473.9) ASTHMATIC BRONCHITIS, ACUTE (ICD-466.0) - no recent exas etc...   HYPERTENSION (ICD-401.9) - controlled on ATENOLOL 50mg /d, and ASA 325mg /d now... BP=126/60 here today & similar at home... denies visual changes, CP, dizziness, syncope, dyspnea, edema, etc... ~  baseline EKG shows NSR, NSSTTWA... ~  NuclearStressTest 8/03 showed ? mild ischemia apical/ lat wall, EF=69%... referred to [facility]- ~  cath 8/03 showed normal coronaries... ~  2DEcho 10/12 showed norm LVF/ wall motion/ & EF=55-60%, mod LAdil  (45mm), neg bubble study.  CHEST PAIN, ATYPICAL (ICD-786.59) & PALPITATIONS (ICD-785.1) - eval 10/09 by Walker Kehr w/ repeat Myoview= neg, no infarct or ischemia, EF= 81%...  she denies recent symptoms.  HYPERCHOLESTEROLEMIA (ICD-272.0) - on ZOCOR 40mg /d & LOPID 600Bid...  ~  FLP 8/08 showed TChol 167, TG 127, HDL 38, LDL 103... ~  FLP 2/09 showed TChol 208, TG 228, HDL 39, LDL 118... continue same med, better diet+ exercise. ~  FLP 8/09 showed TChol 205, TG 187, HDL 40, LDL 124... rec to take meds regularly & repeat. ~  FLP 12/09 on Simva20+LopidBid  showed TChol 218, TG 166, HDL 37, LDL 134... rec> incr Simva40. ~  FLP 6/10 on Simva40+LopidBid showed TChol 187, TG, 129, HDL 48, LDL 113 ~  FLP 12/10 on same showed TChol 182, TG 177, HDL 43, LDL 104... contin Rx, better diet. ~  FLP 6/11 showed TChol 184, TG 158, HDL 41, LDL 111 ~  FLP 12/11 showed TChol 221, TG 156, HDL 39, LDL 144... needs same med, much better diet! ~  FLP 6/12 showed TChol 190, TG 118, HDL 44, LDL 123... Improved  ~  FLP 12/12 on Simva40+Gemfib600Bid showed TChol 188, TG 251, HDL 42, LDL 95... Rec> better low fat diet...  DIABETES MELLITUS, BORDERLINE (ICD-790.29) - on diet alone... ~  labs prev w/ FBS betw 125 - 145... ~  labs 8/09 showed BS= 125, HgA1c= 6.6 on diet therapy... ~  labs 12/09 showed BS= 112, A1c= 6.3.Marland Kitchen. rec> contin diet rx. ~  labs 12/10 showed BS= 114, A1c= 6.3.Marland KitchenMarland Kitchen continue diet rx. ~  labs 12/11 showed BS= 102, A1c= 6.1 ~  Labs 6/12 showed BS= 119, A1c= 6.4.Marland KitchenMarland Kitchen Continue diet efforts. ~  Labs 12/12 on diet alone showed FBS=112, A1c=6.2  HYPOTHYROIDISM (ICD-244.9) - on SYNTHROID 3mcg/d... She is reminded to bring med bottles to office! ~  TSH 8/08 was 7.04 on Synthroid 65mcg/d, therefore increased to 49mcg/d... ~  TSH 2/09 on 71mcg/d was 0.34... keep same... ~  labs 8/09 showed TSH= 4.02 ~  labs 12/09 showed TSH= 4.23 ~  labs 12/10 on Levothy75 showed TSH= 1.24 ~  labs 5/11 on Levothy75 showed TSH=  2.37 ~  Labs 6/12 on Levothy75 showed TSH= 5.46... Reminded to take it every day. ~  Labs 12/12 on Levothy75 showed TSH= 5.81... May need incr dose to 88...  GERD (ICD-530.81) - Protonix changed to DEXILANT 60mg /d & improved per pt...  ~  last EGD was 11/05 showing gastritis... rec for PPI Bid indefinitely... ~  8/09: c/o incr nocturnal reflux symptoms- rec> change Prevacid to Protonix, add Reglan 10mg Qhs, elevate HOB, avoid caffeine, etc... ~  10/10: saw DrStark w/ incr GERD- changed to DEXILANT 60mg /d which helped- planned EGD (not done).  COLONIC POLYPS (ICD-211.3) & IRRITABLE BOWEL SYNDROME (ICD-564.1) - she requests LIBRAX but doesn't want the generic "yellow" caps, she wants the non-generic "blue" caps... ~  last colonoscopy was 8/06 by DrStark showing normal except for hems... f/u planned 61yrs. ~  sched for colonoscopy 11/10 (due to blood in stool) but she refused due to prep required...  DEGENERATIVE JOINT DISEASE (ICD-715.90) & BACK PAIN, LUMBAR (ICD-724.2) - she is off the  Etodolac & Flexeril... off prev Mobic & Tramadol... she takes SALSALATE 750mg  Prn from DrRendall + calcium and vits...  Dr Priscille Kluver referrred her to [facility] for shots in back- she reports improvement.  STROKE, Cerebrovasc Disease >> she went to the ER 9/12 w/ CC weakness, ?back pain- ?felt like she couldn't walk although exam was neg & non-focal; CTBrain w/ atrophy, sm vessel dis, no acute changes; CXR- clear; EKG- SBrady 58/min, LAD, 1st deg AVB, NSSTTWA; LABS- normal, BS=134, Hg=11.6, UA- clear...  Daughter is a Engineer, civil (consulting) & took her to [facility] (we don't have note) who said it wasn't her back;  Then called requesting Neuro eval to r/o stroke> she saw DrWong 10/12 & his exam revealed some ataxia & decr sensation distally; MRI showed atrophy & sm vessel dis, no intracranial lesions (but by DrWong's review he felt she had a splenium infarction); MRA showed intracranial atherosclerotic changes in branch vessels esp in  middle cerebral & post cerebral distrib; MRA neck w/ 58% stenosis prox left ICA, & ectatic vertebrals w/ some narrowing;  He rec ASA 325mg /d + HomePT... He also requested 2DEcho done 10/12 w/ norm LVF/ wall motion/ & EF=55-60%, mod LAdil (45mm), neg bubble study...  They report that the PT has helped, she is ambulating w/ a rolling walker, she is taking the 325mg ASA...  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  ANXIETY (ICD-300.00) - she was taking both Librium10 & Xanax 0.25mg ... she prefers the ALPRAZOLAM 0.25mg  Tid Prn... now on ZOLOFT 100mg /d & AMBIEN 10mg  Qhs for sleep.  Health Maintenance - GYN = DrLomax...  Urology = DrRDavis...  she had Shingles vaccine 6/09 at [facility]... she had PNEUMOVAX 2/09 here... she had the seasonal flu shot 10/09, and the 2010 Flu vaccine...   Past Surgical History  Procedure Date  . Vesicovaginal fistula closure w/ tah   . Polypectomy   . Cataract extraction     Outpatient Encounter Prescriptions as of 08/25/2011  Medication Sig Dispense Refill  . atenolol (TENORMIN) 50 MG tablet TAKE 1 TABLET BY MOUTH ONCE DAILY  30 tablet  10  . butalbital-acetaminophen-caffeine (FIORICET) 50-325-40 MG per tablet Take 1 tablet by mouth every 6 (six) hours as needed.  14 tablet  5  . Calcium Carbonate-Vitamin D (CALTRATE 600+D) 600-400 MG-UNIT per tablet Take 1 tablet by mouth daily.        . clidinium-chlordiazePOXIDE (LIBRAX) 2.5-5 MG per capsule take 1 capsule by mouth UP TO 3 TIMES PER DAY if needed for ABDOMINAL CRAMPING  90 capsule  0  . Diphenhyd-Hydrocort-Nystatin (FIRST-DUKES MOUTHWASH) SUSP 1 tsp gargle and swallow four times daily as needed  120 mL  5  . gemfibrozil (LOPID) 600 MG tablet TAKE 1 TABLET BY MOUTH TWICE A DAY  60 tablet  5  . levothyroxine (SYNTHROID, LEVOTHROID) 75 MCG tablet TAKE 1 TABLET BY MOUTH ONCE DAILY  30 tablet  11  . Multiple Vitamin (MULTIVITAMIN) capsule Take 1 capsule  by mouth daily.        . nitroGLYCERIN (NITROSTAT) 0.4 MG SL tablet Place 1 tablet (0.4 mg total) under the tongue every 5 (five) minutes as needed for chest pain.  100 tablet  3  . salsalate (DISALCID) 750 MG tablet Take 750 mg by mouth 2 (two) times daily. As directed by Dr. Priscille Kluver       . sertraline (ZOLOFT) 100 MG tablet Take 1 tablet (100 mg total) by mouth daily.  30 tablet  11  . DISCONTD: ALPRAZolam (XANAX) 0.25 MG tablet Take 1 tablet (0.25 mg total) by mouth 2 (two) times daily as needed.  60 tablet  5  . DISCONTD: dexlansoprazole (DEXILANT) 60 MG capsule Take 60 mg by mouth daily. 30 minutes before first meal of the day       . DISCONTD: simvastatin (ZOCOR) 40 MG tablet Take 40 mg by mouth at bedtime.        . fluconazole (DIFLUCAN) 100 MG tablet Take 2 tablets by mouth today then 1 tablet by mouth daily until gone  6 tablet  0  . DISCONTD: Clobetasol Propionate (CLOBEX) 0.05 % lotion Apply topically 2 (two) times daily.        Marland Kitchen DISCONTD: Methylcellulose, Laxative, (CITRUCEL) 500 MG TABS Take 1 tablet by mouth 2 (two) times daily.          Allergies  Allergen Reactions  . Penicillins     REACTION: yeast infection    Current Medications, Allergies, Past Medical History, Past Surgical History, Family History, and Social History were reviewed in [employer] record.    Review of Systems        See HPI - all other systems neg except as noted... The patient complains of dyspnea on exertion, headaches, severe indigestion/heartburn, muscle weakness, difficulty walking, and depression.  The patient denies anorexia, fever, weight loss, weight gain, vision loss, decreased hearing, hoarseness, chest pain, syncope, peripheral edema, prolonged cough, hemoptysis, abdominal pain, melena, hematochezia, hematuria, incontinence, suspicious skin lesions, transient blindness, unusual weight change, abnormal bleeding, enlarged lymph nodes, and angioedema.    Objective:    Physical Exam     WD, WN, 77 y/o WF in NAD... GENERAL:  Alert & oriented; pleasant & cooperative... HEENT:  [place]/AT, EOM-wnl, PERRLA, EACs-clear, TMs-wnl, NOSE-clear, THROAT-clear & wnl. NECK:  Supple w/ fairROM; no JVD; normal carotid impulses w/o bruits; no thyromegaly or nodules palpated; no lymphadenopathy. CHEST:  Clear to P & A; without wheezes/ rales/ or rhonchi. HEART:  Regular Rhythm; without murmurs/ rubs/ or gallops. ABDOMEN:  Soft & nontender; normal bowel sounds; no organomegaly or masses detected. EXT: without deformities, mildarthritic changes; no varicose veins/ venous insuffic/ or edema. NEURO:  CN's intact; no focal neuro deficits other than gait abn... DERM:  No lesions noted; no rash etc...  RADIOLOGY DATA:  Reviewed in the EPIC EMR & discussed w/ the patient...  LABORATORY DATA:  Reviewed in the EPIC EMR & discussed w/ the patient...   Assessment & Plan:   HBP>  Controlled on Atenolol, continue same...  CHOL>  On Simva40 + Gemfib Bid; FLP is reviewed & she needs a better low fat diet!!!  DM>  Stable on diet alone & we reviewed low carb restriction etc...  HYPOTHY>  There is some confusion about her Levothy dose- 75, ?88, and she is reminded these questions are easily answered if she brings all her med bottles to every visit... Keep same for now & bring bottles on return...  GI>  GERD/ IBS/ Polyps>  Continue Dexilant, Librax, etc...  DJD/ LBP>  She continues on Disalcid from DrRendall...  Neuro> ?TIA, ?Stroke>  See above, eval per DrWong & he has rec PT/OT (it is  helping) and ASA 325mg /d...  HA/ Anxiety/ Insomnia/ etc>  She requests refills for her chronic meds including: Fioricet, Alprazolam, Zoloft, Ambien.Marland KitchenMarland Kitchen

## 2011-09-16 ENCOUNTER — Telehealth: Payer: Self-pay | Admitting: Pulmonary Disease

## 2011-09-16 MED ORDER — DOXYCYCLINE HYCLATE 100 MG PO TABS: 100.0000 mg | ORAL_TABLET | Freq: Two times a day (BID) | ORAL | Status: AC

## 2011-09-16 MED ORDER — FLUCONAZOLE 100 MG PO TABS: 100.0000 mg | ORAL_TABLET | Freq: Every day | ORAL | Status: DC

## 2011-09-16 NOTE — Telephone Encounter (Signed)
Per SN--offer doxycycline 100mg   #20  1 po bid .   lmomtcb for pt to make her aware that abx has been sent to her pharmacy.

## 2011-09-16 NOTE — Telephone Encounter (Signed)
Pt c/o sinus congestion, PND, sore throat, cough w/ occasional green phlem, runny nose, chills, sinus headaches, nausea 3 days. Pt denies any fever, vomiting, wheezing, chest tightness. Pt is requesting an abx be called in for her. Please advise Dr. Kriste Basque, thanks  Allergies  Allergen Reactions  . Penicillins     REACTION: yeast infection    [facility] Kathryne Sharper

## 2011-09-16 NOTE — Telephone Encounter (Signed)
Daughter called back and she is aware of meds sent to the pharmacy per SN recs.

## 2011-10-05 ENCOUNTER — Other Ambulatory Visit: Payer: Self-pay | Admitting: Pulmonary Disease

## 2011-10-06 ENCOUNTER — Other Ambulatory Visit: Payer: Self-pay | Admitting: Pulmonary Disease

## 2011-10-21 ENCOUNTER — Other Ambulatory Visit: Payer: Self-pay | Admitting: Pulmonary Disease

## 2011-11-15 ENCOUNTER — Telehealth: Payer: Self-pay | Admitting: Pulmonary Disease

## 2011-11-15 MED ORDER — AZITHROMYCIN 250 MG PO TABS: ORAL_TABLET | ORAL | Status: AC

## 2011-11-15 MED ORDER — FLUCONAZOLE 100 MG PO TABS: ORAL_TABLET | ORAL | Status: DC

## 2011-11-15 NOTE — Telephone Encounter (Signed)
Spoke with [name]-states patient is having runny nose, PND, sneezing, and just feels bad, cough non productive. Has tried Benadryl, Aleve,and Chlor-Trimeton. Would like something called to pharmacy. Please advise.

## 2011-11-15 NOTE — Telephone Encounter (Signed)
Per SN-okay to give Zpak # 1 take as directed no refills. Pts daughter requested Diflucan as well; Per SN-ok to give Diflucan 100mg  #6 take 2 the first day then 1 daily until gone no refills.

## 2011-11-29 ENCOUNTER — Ambulatory Visit: Payer: Medicare Other | Admitting: Adult Health

## 2011-12-02 ENCOUNTER — Encounter: Payer: Self-pay | Admitting: Adult Health

## 2011-12-02 ENCOUNTER — Ambulatory Visit (INDEPENDENT_AMBULATORY_CARE_PROVIDER_SITE_OTHER): Payer: Medicare Other | Admitting: Adult Health

## 2011-12-02 VITALS — BP 124/66 | HR 55 | Temp 96.7°F | Ht 62.0 in | Wt 123.2 lb

## 2011-12-02 DIAGNOSIS — K219 Gastro-esophageal reflux disease without esophagitis: Secondary | ICD-10-CM

## 2011-12-02 MED ORDER — CILIDINIUM-CHLORDIAZEPOXIDE 2.5-5 MG PO CAPS: 1.0000 | ORAL_CAPSULE | Freq: Three times a day (TID) | ORAL | Status: DC | PRN

## 2011-12-02 NOTE — Patient Instructions (Addendum)
Restart Dexilant 60mg  daily  Stop Prilosec and Tagamet.  Continue on Zantac 150mg  At bedtime   GERD diet  GAS X with meals  Librax Three times a day  As needed  Abdominal spasm  We are referring you back to GI  Please contact office for sooner follow up if symptoms do not improve or worsen or seek emergency care  follow up Dr. Kriste Basque  As planned .

## 2011-12-02 NOTE — Assessment & Plan Note (Addendum)
Recurrent flare -slow to resolve Plan:  Restart Dexilant 60mg  daily  Stop Prilosec and Tagamet.  Continue on Zantac 150mg  At bedtime   GERD diet  GAS X with meals  Librax Three times a day  As needed  Abdominal spasm  We are referring you back to GI  Please contact office for sooner follow up if symptoms do not improve or worsen or seek emergency care  follow up Dr. Kriste Basque  As planned .

## 2011-12-02 NOTE — Progress Notes (Signed)
Subjective:    Patient ID: Mary Alexander, female    DOB: 07/11/1927, 78 y.o.   MRN: [id number]  HPI 78  y/o WF    ~  February 22, 2010:  states she's been doing "bad" w/ "yeast" found by DrLomax & JYNWGNFA- now resolved... she had BMD 5/11 by GYN w/ TScores -2.0 in [facility], but OK in other sites & DrLomax only rec Caltrate + MVI... she's been taking Mobic/ Tramadol Prn for arthritis pain, and increased the Zoloft to 100mg /d w/ improvement... labs done 5/11> all essentially WNL.  ~  August 23, 2010:  She & husb moved in w/ daugh... c/o headaches & insomnia... tried Seroquel w/o benefit she says & wants Ambien (took one of her daughter's & it helped)... also wants refill Fioricet for HAs & MMW for throat...  ~  February 21, 2011:  59mo ROV & she reports "same old thing" meaning that they are living w/ daugh since her husb is ill, getting worse, needs lots of help & stressful for her;  Requests Ambien refill to help her rest + her Fioricet for HAs, Alprazolam for nerves, and Zoloft for depression...  Fasting labs showed LDL 123, FBS 119, A1c 6.4, Hg 12.6, TSH 5.46> SEE PROB LIST below...  ~  August 25, 2011:  59mo ROV & she went to the ER 9/12 w/ CC weakness, ?back pain- ?felt like she couldn't walk although exam was neg & non-focal; CTBrain w/ atrophy, sm vessel dis, no acute changes; CXR- clear; EKG- SBrady 58/min, LAD, 1st deg AVB, NSSTTWA; LABS- normal, BS=134, Hg=11.6, UA- clear...  Daughter is a Engineer, civil (consulting) & took her to [facility] (we don't have note) who said it wasn't her back;  Then called requesting Neuro eval to r/o stroke> she saw DrWong 10/12 & his exam revealed some ataxia & decr sensation distally; MRI showed atrophy & sm vessel dis, no intracranial lesions (but by DrWong's review he felt she had a splenium infarction); MRA showed intracranial atherosclerotic changes in branch vessels esp in middle cerebral & post cerebral distrib; MRA neck w/ 58% stenosis prox left ICA, & ectatic vertebrals w/  some narrowing;  He rec ASA 325mg /d + HomePT... He also requested 2DEcho done 10/12 w/ norm LVF/ wall motion/ & EF=55-60%, mod LAdil (45mm), neg bubble study...  They report that the PT has helped, she is ambulating w/ a rolling walker, she is taking the 325mg ASA...  HBP> on Aten50; BP= 126/60 w/o CP, palpit, SOB (but too sedentary), edema, etc...  Hyperlipid> on Simva40 & Lopid600Bid; FLP showed TChol 188, TG 251, HDL 42, LDL 95==> needs better low fat diet...  DM> on diet alone & labs showed FBS=112, A1c=6.2  Hypothyroid> on Synthroid75; TSH= 5.81 & reminded to take it everyday, if trend is up we will tweek dose later...  GERD/ IBS/ Polyps> on Dexilant60 & Librax prn; c/o gas & rec to add Simethacone preps prn- eg. Mylicon, Phazyme, etc...  GYN> she is c/o yeast infection & she is referred to her GYN for exam; they request Diflucan Rx (daugh is a Engineer, civil (consulting))- ok...  DJD/ LBP> on Disalcid750Bid per DrRendall; she is c/o neck pain and knee pain & DrRendall has done XRays & scans they tell me "it's normal wear & tear", using icey hot patches & spray as needed  HAs> on Fioricet prn & she insists on the "Teva blues" from her pharm...  Anxiety & Depression> on Zoloft100, Xanax0.25prn, Ambien10prn;     12/02/2011 ER follow up  Complains  of indegestion and gas x31month, difficulty sleeping. went to ED 10days ago and was given prilosec, tagamet and zantac. She says she was cleared from cardiac per pt and reports nml labs. Records not available at this time. Was given GI cocktail. ER recommended PPI and Zantac. It is not helping. Still having burping, gas, bloating, decreased appetite. Has sour taste in throat. No diarrhea, no bloody stools. Prior to ER visit was taking Dexilant without missed doses.  Started Probiotics prior to ER visit but had to stop due to gas.  No weight loss or fever.    Problem List:  ALLERGIC RHINITIS (ICD-477.9) - on ALLEGRA 180mg /d just Prn now... SINUSITIS  (ICD-473.9) ASTHMATIC BRONCHITIS, ACUTE (ICD-466.0) - no recent exas etc...   HYPERTENSION (ICD-401.9) - controlled on ATENOLOL 50mg /d, and ASA 325mg /d now... BP=126/60 here today & similar at home... denies visual changes, CP, dizziness, syncope, dyspnea, edema, etc... ~  baseline EKG shows NSR, NSSTTWA... ~  NuclearStressTest 8/03 showed ? mild ischemia apical/ lat wall, EF=69%... referred to [facility]- ~  cath 8/03 showed normal coronaries... ~  2DEcho 10/12 showed norm LVF/ wall motion/ & EF=55-60%, mod LAdil (45mm), neg bubble study.  CHEST PAIN, ATYPICAL (ICD-786.59) & PALPITATIONS (ICD-785.1) - eval 10/09 by Walker Kehr w/ repeat Myoview= neg, no infarct or ischemia, EF= 81%...  she denies recent symptoms.  HYPERCHOLESTEROLEMIA (ICD-272.0) - on ZOCOR 40mg /d & LOPID 600Bid...  ~  FLP 8/08 showed TChol 167, TG 127, HDL 38, LDL 103... ~  FLP 2/09 showed TChol 208, TG 228, HDL 39, LDL 118... continue same med, better diet+ exercise. ~  FLP 8/09 showed TChol 205, TG 187, HDL 40, LDL 124... rec to take meds regularly & repeat. ~  FLP 12/09 on Simva20+LopidBid  showed TChol 218, TG 166, HDL 37, LDL 134... rec> incr Simva40. ~  FLP 6/10 on Simva40+LopidBid showed TChol 187, TG, 129, HDL 48, LDL 113 ~  FLP 12/10 on same showed TChol 182, TG 177, HDL 43, LDL 104... contin Rx, better diet. ~  FLP 6/11 showed TChol 184, TG 158, HDL 41, LDL 111 ~  FLP 12/11 showed TChol 221, TG 156, HDL 39, LDL 144... needs same med, much better diet! ~  FLP 6/12 showed TChol 190, TG 118, HDL 44, LDL 123... Improved  ~  FLP 12/12 on Simva40+Gemfib600Bid showed TChol 188, TG 251, HDL 42, LDL 95... Rec> better low fat diet...  DIABETES MELLITUS, BORDERLINE (ICD-790.29) - on diet alone... ~  labs prev w/ FBS betw 125 - 145... ~  labs 8/09 showed BS= 125, HgA1c= 6.6 on diet therapy... ~  labs 12/09 showed BS= 112, A1c= 6.3.Marland Kitchen. rec> contin diet rx. ~  labs 12/10 showed BS= 114, A1c= 6.3.Marland KitchenMarland Kitchen continue diet rx. ~  labs 12/11  showed BS= 102, A1c= 6.1 ~  Labs 6/12 showed BS= 119, A1c= 6.4.Marland KitchenMarland Kitchen Continue diet efforts. ~  Labs 12/12 on diet alone showed FBS=112, A1c=6.2  HYPOTHYROIDISM (ICD-244.9) - on SYNTHROID 66mcg/d... She is reminded to bring med bottles to office! ~  TSH 8/08 was 7.04 on Synthroid 74mcg/d, therefore increased to 2mcg/d... ~  TSH 2/09 on 46mcg/d was 0.34... keep same... ~  labs 8/09 showed TSH= 4.02 ~  labs 12/09 showed TSH= 4.23 ~  labs 12/10 on Levothy75 showed TSH= 1.24 ~  labs 5/11 on Levothy75 showed TSH= 2.37 ~  Labs 6/12 on Levothy75 showed TSH= 5.46... Reminded to take it every day. ~  Labs 12/12 on Levothy75 showed TSH= 5.81... May need incr dose to  88.Marland Kitchen.  GERD (ICD-530.81) - Protonix changed to DEXILANT 60mg /d & improved per pt...  ~  last EGD was 11/05 showing gastritis... rec for PPI Bid indefinitely... ~  8/09: c/o incr nocturnal reflux symptoms- rec> change Prevacid to Protonix, add Reglan 10mg Qhs, elevate HOB, avoid caffeine, etc... ~  10/10: saw DrStark w/ incr GERD- changed to DEXILANT 60mg /d which helped- planned EGD (not done).  COLONIC POLYPS (ICD-211.3) & IRRITABLE BOWEL SYNDROME (ICD-564.1) - she requests LIBRAX but doesn't want the generic "yellow" caps, she wants the non-generic "blue" caps... ~  last colonoscopy was 8/06 by DrStark showing normal except for hems... f/u planned 67yrs. ~  sched for colonoscopy 11/10 (due to blood in stool) but she refused due to prep required...  DEGENERATIVE JOINT DISEASE (ICD-715.90) & BACK PAIN, LUMBAR (ICD-724.2) - she is off the  Etodolac & Flexeril... off prev Mobic & Tramadol... she takes SALSALATE 750mg  Prn from DrRendall + calcium and vits...  Dr Priscille Kluver referrred her to [facility] for shots in back- she reports improvement.  STROKE, Cerebrovasc Disease >> she went to the ER 9/12 w/ CC weakness, ?back pain- ?felt like she couldn't walk although exam was neg & non-focal; CTBrain w/ atrophy, sm vessel dis, no acute changes; CXR- clear;  EKG- SBrady 58/min, LAD, 1st deg AVB, NSSTTWA; LABS- normal, BS=134, Hg=11.6, UA- clear...  Daughter is a Engineer, civil (consulting) & took her to [facility] (we don't have note) who said it wasn't her back;  Then called requesting Neuro eval to r/o stroke> she saw DrWong 10/12 & his exam revealed some ataxia & decr sensation distally; MRI showed atrophy & sm vessel dis, no intracranial lesions (but by DrWong's review he felt she had a splenium infarction); MRA showed intracranial atherosclerotic changes in branch vessels esp in middle cerebral & post cerebral distrib; MRA neck w/ 58% stenosis prox left ICA, & ectatic vertebrals w/ some narrowing;  He rec ASA 325mg /d + HomePT... He also requested 2DEcho done 10/12 w/ norm LVF/ wall motion/ & EF=55-60%, mod LAdil (45mm), neg bubble study...  They report that the PT has helped, she is ambulating w/ a rolling walker, she is taking the 325mg ASA...  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  ANXIETY (ICD-300.00) - she was taking both Librium10 & Xanax 0.25mg ... she prefers the ALPRAZOLAM 0.25mg  Tid Prn... now on ZOLOFT 100mg /d & AMBIEN 10mg  Qhs for sleep.  Health Maintenance - GYN = DrLomax...  Urology = DrRDavis...  she had Shingles vaccine 6/09 at [facility]... she had PNEUMOVAX 2/09 here... she had the seasonal flu shot 10/09, and the 2010 Flu vaccine...   Past Surgical History  Procedure Date  . Vesicovaginal fistula closure w/ tah   . Polypectomy   . Cataract extraction     Outpatient Encounter Prescriptions as of 12/02/2011  Medication Sig Dispense Refill  . ALPRAZolam (XANAX) 0.25 MG tablet take 1 tablet by mouth twice a day if needed  60 tablet  5  . aspirin 325 MG tablet Take 325 mg by mouth daily.      Marland Kitchen atenolol (TENORMIN) 50 MG tablet TAKE 1 TABLET BY MOUTH ONCE DAILY  30 tablet  10  . butalbital-acetaminophen-caffeine (FIORICET) 50-325-40 MG per tablet Take 1 tablet by mouth every 6 (six)  hours as needed.  14 tablet  5  . Calcium Carbonate-Vitamin D (CALTRATE 600+D) 600-400 MG-UNIT per tablet Take 1 tablet by mouth daily.        Marland Kitchen  gemfibrozil (LOPID) 600 MG tablet TAKE 1 TABLET BY MOUTH TWICE A DAY  60 tablet  5  . levothyroxine (SYNTHROID, LEVOTHROID) 75 MCG tablet TAKE 1 TABLET BY MOUTH ONCE DAILY  30 tablet  11  . Multiple Vitamin (MULTIVITAMIN) capsule Take 1 capsule by mouth daily.        . nitroGLYCERIN (NITROSTAT) 0.4 MG SL tablet Place 1 tablet (0.4 mg total) under the tongue every 5 (five) minutes as needed for chest pain.  100 tablet  3  . salsalate (DISALCID) 750 MG tablet Take 750 mg by mouth 2 (two) times daily. As directed by Dr. Priscille Kluver       . sertraline (ZOLOFT) 100 MG tablet Take 1 tablet (100 mg total) by mouth daily.  30 tablet  11  . simvastatin (ZOCOR) 40 MG tablet take 1 tablet by mouth at bedtime  30 tablet  5  . zolpidem (AMBIEN) 10 MG tablet take 1 tablet by mouth at bedtime if needed for sleep  30 tablet  5  . clidinium-chlordiazePOXIDE (LIBRAX) 2.5-5 MG per capsule take 1 capsule by mouth UP TO 3 TIMES PER DAY if needed for ABDOMINAL CRAMPING  90 capsule  0  . DEXILANT 60 MG capsule take 1 capsule by mouth once daily -30 MIN BEFORE THE 1ST MEAL OF THE DAY  30 capsule  5  . Diphenhyd-Hydrocort-Nystatin (FIRST-DUKES MOUTHWASH) SUSP 1 tsp gargle and swallow four times daily as needed  120 mL  5  . fluconazole (DIFLUCAN) 100 MG tablet take 1 tablet by mouth once daily  4 tablet  0  . fluconazole (DIFLUCAN) 100 MG tablet Take 2 today then 1 daily until gone  6 tablet  0  . nystatin-triamcinolone ointment (MYCOLOG) APPLY TO AFFECTED AREA 2 TO 3 TIMES A DAY FOR UP TO 2 WEEKS, THEN USE 1 TO 3 TIMES A WEEK.  30 g  PRN    Allergies  Allergen Reactions  . Penicillins     REACTION: yeast infection    Current Medications, Allergies, Past Medical History, Past Surgical History, Family History, and Social History were reviewed in [employer]  record.    Review of Systems        Constitutional:   No  weight loss, night sweats,  Fevers, chills,  +fatigue, or  lassitude.  HEENT:   No headaches,  Difficulty swallowing,  Tooth/dental problems, or  Sore throat,                No sneezing, itching, ear ache, nasal congestion, post nasal drip,   CV:  No chest pain,  Orthopnea, PND, swelling in lower extremities, anasarca, dizziness, palpitations, syncope.   GI  No   vomiting, diarrhea  Resp: No shortness of breath with exertion or at rest.  No excess mucus, no productive cough,  No non-productive cough,  No coughing up of blood.  No change in color of mucus.  No wheezing.  No chest wall deformity  Skin: no rash or lesions.  GU: no dysuria, change in color of urine, no urgency or frequency.  No flank pain, no hematuria   MS:  No joint pain or swelling.  No decreased range of motion.  No back pain.  Psych:  No change in mood or affect. No depression or anxiety.  No memory loss.       Objective:   Physical Exam     WD, WN, 78 y/o WF in NAD... GENERAL:  Alert & oriented; pleasant & cooperative.Marland KitchenMarland Kitchen  HEENT:  [place]/AT,  , EACs-clear, TMs-wnl, NOSE-clear, THROAT-clear & wnl. NECK:  Supple w/ fairROM; no JVD; normal carotid impulses w/o bruits; no thyromegaly or nodules palpated; no lymphadenopathy. CHEST:  Clear to P & A; without wheezes/ rales/ or rhonchi. HEART:  Regular Rhythm; without murmurs/ rubs/ or gallops. ABDOMEN:  Soft & nontender; normal bowel sounds; no organomegaly or masses detected., no guarding or rebound  EXT: without deformities, mildarthritic changes; no varicose veins/ venous insuffic/ or edema. NEURO:   no focal neuro deficits other than gait abn... DERM:  No lesions noted; no rash etc...     Assessment & Plan:

## 2011-12-02 NOTE — Progress Notes (Signed)
Addended by: Boone Master E on: 12/02/2011 11:34 AM   Modules accepted: Orders

## 2011-12-05 ENCOUNTER — Other Ambulatory Visit: Payer: Self-pay | Admitting: Pulmonary Disease

## 2011-12-05 ENCOUNTER — Telehealth: Payer: Self-pay | Admitting: Neurology

## 2011-12-05 DIAGNOSIS — Z1231 Encounter for screening mammogram for malignant neoplasm of breast: Secondary | ICD-10-CM

## 2011-12-05 NOTE — Telephone Encounter (Signed)
Likely tinnitus.  Usually difficult to treat, but would suggest seeing an ENT.  We can also talk about it when she returns for her appt.

## 2011-12-05 NOTE — Telephone Encounter (Signed)
Called and spoke with the patient. She states she is having a "roaring in her left ear that is driving her crazy". She believes that it started when she had her stroke back in October but can't be sure. It usually only bothers her at bedtime but here lately it's all the time. Denies any other sx such as vision problems, slurred speech or difficulty walking. In fact the patient says she is walking a little better now than before. She wanted to know if there is anything that can be done to fix the roaring or does she need to see an ENT? She does have a f/u with Dr. Modesto Charon on 12/23/11. I told her I would ask Dr. Modesto Charon about the roaring and see what he recommended. The patient is ok with this plan. **Dr. Modesto Charon please advise. Thank you.

## 2011-12-06 NOTE — Telephone Encounter (Signed)
Called and spoke with the patient. Information given as per Dr. Modesto Charon re: tinnitus. The patient states the roaring has not been as bad or has not been constant like it was yesterday. She is going to try a "noise machine" tonight as recommended by one of her friends. She states she will listen to the ocean tonight when she goes to bed. She states she may see an ENT as well. I told her that if we could be of any assistance to just give [place] a call. The patient states she will. No other issues or concerns voiced at this time. She was reminded of her f/u appointment with Dr. Modesto Charon on 04/12.

## 2011-12-14 ENCOUNTER — Ambulatory Visit: Payer: Medicare Other

## 2011-12-23 ENCOUNTER — Ambulatory Visit (INDEPENDENT_AMBULATORY_CARE_PROVIDER_SITE_OTHER): Payer: Medicare Other | Admitting: Neurology

## 2011-12-23 ENCOUNTER — Other Ambulatory Visit (INDEPENDENT_AMBULATORY_CARE_PROVIDER_SITE_OTHER): Payer: Medicare Other

## 2011-12-23 ENCOUNTER — Encounter: Payer: Self-pay | Admitting: Neurology

## 2011-12-23 DIAGNOSIS — R29818 Other symptoms and signs involving the nervous system: Secondary | ICD-10-CM

## 2011-12-23 LAB — SEDIMENTATION RATE: Sed Rate: 21 mm/hr (ref 0–22)

## 2011-12-23 LAB — CBC WITH DIFFERENTIAL/PLATELET
Basophils Relative: 1.7 % (ref 0.0–3.0)
Eosinophils Absolute: 0.2 10*3/uL (ref 0.0–0.7)
HCT: 36.4 % (ref 36.0–46.0)
Lymphs Abs: 1.6 10*3/uL (ref 0.7–4.0)
MCHC: 33.1 g/dL (ref 30.0–36.0)
MCV: 89.9 fl (ref 78.0–100.0)
Monocytes Absolute: 0.6 10*3/uL (ref 0.1–1.0)
Neutrophils Relative %: 55.9 % (ref 43.0–77.0)
Platelets: 245 10*3/uL (ref 150.0–400.0)

## 2011-12-23 MED ORDER — NORTRIPTYLINE HCL 10 MG PO CAPS: ORAL_CAPSULE | ORAL | Status: DC

## 2011-12-23 NOTE — Progress Notes (Signed)
Dear Dr. Kriste Basque,  I saw  Mary Alexander back in [facility] for her problem with her problem walking. As you know she is an 78 year old woman with a history of hyperlipidemia, hypertension and degenerative disease of the spine who with complaints of sudden difficulty walking on September 18th. She felt like she couldn't feel her legs. She denies weakness in the arms, dizziness or vertigo, dysphagia or dysarthria or changes in her vision.   She was seen initially in the ED and was sent home without a diagnosis. A CT head was unremarkable. She saw her orthopedist who follows her degenerative back disease and he schedule an EMG/NCS.   She has had gradual improvement in her symptoms but still needs assistance to walk.  I felt her presentation was most consistent with a ischemic stroke, likely involving her cerebellum.  A MRI brain revealed mild to moderate small vessel disease and mild atrophy with no signs of chronic strokes - however I felt she had a splenium infarction as well on MRI, and this was verified by neurorads.  MRA head did reveal some mild to moderate atherosclerosis in both the MCA and PCAs.  She was also noted to have a 60% stenosis of the left ICA.  Imbalance continues, continues to have neck pain, shoots up into the head, got worse in the past few weeks.  Worse on the commode.    Also, had tinnitus in left ear, and now headaches in left side of headache.  Had history of headaches.  No jaw claudication.  Taking fioricet 1-2 times per month.    Her headaches have increased in frequency, she thinks secondary to stress.  Tinnitus is improving with no treatment, not pulsatile.     Medical history, social history, and family history were reviewed and have not changed since the last clinic visit.  Current Outpatient Prescriptions on File Prior to Visit  Medication Sig Dispense Refill  . ALPRAZolam (XANAX) 0.25 MG tablet take 1 tablet by mouth twice a day if needed  60 tablet   5  . aspirin 325 MG tablet Take 325 mg by mouth daily.      Marland Kitchen atenolol (TENORMIN) 50 MG tablet TAKE 1 TABLET BY MOUTH ONCE DAILY  30 tablet  10  . butalbital-acetaminophen-caffeine (FIORICET) 50-325-40 MG per tablet Take 1 tablet by mouth every 6 (six) hours as needed.  14 tablet  5  . Calcium Carbonate-Vitamin D (CALTRATE 600+D) 600-400 MG-UNIT per tablet Take 1 tablet by mouth daily.        . clidinium-chlordiazePOXIDE (LIBRAX) 2.5-5 MG per capsule Take 1 capsule by mouth 3 (three) times daily as needed.  90 capsule  0  . DEXILANT 60 MG capsule take 1 capsule by mouth once daily -30 MIN BEFORE THE 1ST MEAL OF THE DAY  30 capsule  5  . Diphenhyd-Hydrocort-Nystatin (FIRST-DUKES MOUTHWASH) SUSP 1 tsp gargle and swallow four times daily as needed  120 mL  5  . fluconazole (DIFLUCAN) 100 MG tablet take 1 tablet by mouth once daily  4 tablet  0  . fluconazole (DIFLUCAN) 100 MG tablet Take 2 today then 1 daily until gone  6 tablet  0  . gemfibrozil (LOPID) 600 MG tablet TAKE 1 TABLET BY MOUTH TWICE A DAY  60 tablet  5  . levothyroxine (SYNTHROID, LEVOTHROID) 75 MCG tablet TAKE 1 TABLET BY MOUTH ONCE DAILY  30 tablet  11  . Multiple Vitamin (MULTIVITAMIN) capsule Take 1 capsule by mouth  daily.        . nitroGLYCERIN (NITROSTAT) 0.4 MG SL tablet Place 1 tablet (0.4 mg total) under the tongue every 5 (five) minutes as needed for chest pain.  100 tablet  3  . nystatin-triamcinolone ointment (MYCOLOG) APPLY TO AFFECTED AREA 2 TO 3 TIMES A DAY FOR UP TO 2 WEEKS, THEN USE 1 TO 3 TIMES A WEEK.  30 g  PRN  . salsalate (DISALCID) 750 MG tablet Take 750 mg by mouth 2 (two) times daily. As directed by Dr. Priscille Kluver       . sertraline (ZOLOFT) 100 MG tablet Take 1 tablet (100 mg total) by mouth daily.  30 tablet  11  . simvastatin (ZOCOR) 40 MG tablet take 1 tablet by mouth at bedtime  30 tablet  5  . zolpidem (AMBIEN) 10 MG tablet take 1 tablet by mouth at bedtime if needed for sleep  30 tablet  5    Allergies    Allergen Reactions  . Penicillins     REACTION: yeast infection    ROS:  13 systems were reviewed and are notable for heart burn.  She is also complaining of pain in the eyes.  All other review of systems are unremarkable.  Exam: . Filed Vitals:   12/23/11 0930  BP: 126/72  Pulse: 64  Weight: 124 lb (56.246 kg)    In general, well appearing women.  H&N: bilaterally palpable temporal pulses, no tenderness, no TMJ crepitus, no thickening of temporal artery.  Mental status:   The patient is oriented to person, place and time. Recent and remote memory are intact. Attention span and concentration are normal. Language including repetition, naming, following commands are intact. Fund of knowledge of current and historical events, as well as vocabulary are normal.  Cranial Nerves:  Visual fields full to confrontation. Extraocular movements are intact without nystagmus. Facial sensation and muscles of mastication are intact. Muscles of facial expression are symmetric.  Tongue protrusion, uvula, palate midline.  Shoulder shrug intact  Motor:  Normal bulk and tone, no drift and 5/5 muscle strength bilaterally.  Reflexes:  2+ thoughout, toes down.  Coordination:  Normal finger to nose  Gait:  Wide based gait.  Impression/Recommendations:  1.  Ischemic stroke of the splenium - continue aspirin 325mg   2.  Tinnitus - likely benign phenomenon. 3.  Headaches - history of migraine headaches, likely worse due to chronic stress of taking care of husband with dementia.  Will check ESR, CRP, CBC today, but low suspicion of TA.  Have started nortriptyline 10->20.  Told patient to hold ambien for now until she sees what the nortriptyline does.  We will see the patient back in 2  months.  Lupita Raider Modesto Charon, MD [facility], [place]

## 2011-12-23 NOTE — Patient Instructions (Signed)
Go to the basement to have your labs drawn today.  . 

## 2011-12-27 ENCOUNTER — Other Ambulatory Visit: Payer: Self-pay | Admitting: Adult Health

## 2011-12-29 ENCOUNTER — Telehealth: Payer: Self-pay | Admitting: Pulmonary Disease

## 2011-12-29 MED ORDER — CILIDINIUM-CHLORDIAZEPOXIDE 2.5-5 MG PO CAPS: 1.0000 | ORAL_CAPSULE | Freq: Three times a day (TID) | ORAL | Status: DC | PRN

## 2011-12-29 NOTE — Telephone Encounter (Signed)
Pt's daughter called back & stated that pt is completely out of this medication & has not had any today.  Please advise when able to pick up at [phone].  Antionette Fairy

## 2012-01-11 ENCOUNTER — Other Ambulatory Visit: Payer: Self-pay | Admitting: *Deleted

## 2012-01-11 MED ORDER — FIRST-DUKES MOUTHWASH MT SUSP: OROMUCOSAL | Status: DC

## 2012-02-14 ENCOUNTER — Other Ambulatory Visit: Payer: Self-pay | Admitting: Pulmonary Disease

## 2012-02-23 ENCOUNTER — Encounter: Payer: Self-pay | Admitting: Pulmonary Disease

## 2012-02-23 ENCOUNTER — Other Ambulatory Visit (INDEPENDENT_AMBULATORY_CARE_PROVIDER_SITE_OTHER): Payer: Medicare Other

## 2012-02-23 ENCOUNTER — Ambulatory Visit (INDEPENDENT_AMBULATORY_CARE_PROVIDER_SITE_OTHER): Payer: Medicare Other | Admitting: Pulmonary Disease

## 2012-02-23 VITALS — BP 126/60 | HR 60 | Temp 97.0°F | Ht 62.0 in | Wt 129.2 lb

## 2012-02-23 DIAGNOSIS — K589 Irritable bowel syndrome without diarrhea: Secondary | ICD-10-CM

## 2012-02-23 DIAGNOSIS — E7800 Pure hypercholesterolemia, unspecified: Secondary | ICD-10-CM

## 2012-02-23 DIAGNOSIS — E039 Hypothyroidism, unspecified: Secondary | ICD-10-CM

## 2012-02-23 DIAGNOSIS — R7309 Other abnormal glucose: Secondary | ICD-10-CM

## 2012-02-23 DIAGNOSIS — M545 Low back pain, unspecified: Secondary | ICD-10-CM

## 2012-02-23 DIAGNOSIS — I10 Essential (primary) hypertension: Secondary | ICD-10-CM

## 2012-02-23 DIAGNOSIS — K219 Gastro-esophageal reflux disease without esophagitis: Secondary | ICD-10-CM

## 2012-02-23 DIAGNOSIS — M1990 Unspecified osteoarthritis, unspecified site: Secondary | ICD-10-CM

## 2012-02-23 DIAGNOSIS — R51 Headache: Secondary | ICD-10-CM

## 2012-02-23 DIAGNOSIS — D126 Benign neoplasm of colon, unspecified: Secondary | ICD-10-CM

## 2012-02-23 DIAGNOSIS — F411 Generalized anxiety disorder: Secondary | ICD-10-CM

## 2012-02-23 DIAGNOSIS — G459 Transient cerebral ischemic attack, unspecified: Secondary | ICD-10-CM

## 2012-02-23 LAB — BASIC METABOLIC PANEL
Calcium: 9.9 mg/dL (ref 8.4–10.5)
GFR: 52.02 mL/min — ABNORMAL LOW (ref >60.00)
Potassium: 4.5 mEq/L (ref 3.5–5.1)
Sodium: 142 mEq/L (ref 135–145)

## 2012-02-23 LAB — LIPID PANEL
LDL Cholesterol: 92 mg/dL (ref 0–99)
Total CHOL/HDL Ratio: 4
VLDL: 28.4 mg/dL (ref 0.0–40.0)

## 2012-02-23 LAB — HEMOGLOBIN A1C: Hgb A1c MFr Bld: 6.2 % (ref 4.6–6.5)

## 2012-02-25 ENCOUNTER — Encounter: Payer: Self-pay | Admitting: Pulmonary Disease

## 2012-02-25 NOTE — Progress Notes (Signed)
Subjective:    Patient ID: Mary Alexander, female    DOB: 10/17/1926, 78 y.o.   MRN: [id number]  HPI 78 y/o WF here for a follow up visit... she has mult somatic complaints & mult medical problems as below:  ~  Jun11:  states she's been doing "bad" w/ "yeast" found by DrLomax & DrRDavis- now resolved... she had BMD 5/11 by GYN w/ TScores -2.0 in [facility], but OK in other sites & DrLomax only rec Caltrate + MVI... she's been taking Mobic/ Tramadol Prn for arthritis pain, and increased the Zoloft to 100mg /d w/ improvement... labs done 5/11> all essentially WNL. ~  Dec11:  She & husb moved in w/ daugh... c/o headaches & insomnia... tried Seroquel w/o benefit she says & wants Ambien (took one of her daughter's & it helped)... also wants refill Fioricet for HAs & MMW for throat...  ~  February 21, 2011:  85mo ROV & she reports "same old thing" meaning that they are living w/ daugh since her husb is ill, getting worse, needs lots of help & stressful for her;  Requests Ambien refill to help her rest + her Fioricet for HAs, Alprazolam for nerves, and Zoloft for depression...  Fasting labs showed LDL 123, FBS 119, A1c 6.4, Hg 12.6, TSH 5.46> SEE PROB LIST below...  ~  August 25, 2011:  85mo ROV & she went to the ER 9/12 w/ CC weakness, ?back pain- ?felt like she couldn't walk although exam was neg & non-focal; CTBrain w/ atrophy, sm vessel dis, no acute changes; CXR- clear; EKG- SBrady 58/min, LAD, 1st deg AVB, NSSTTWA; LABS- normal, BS=134, Hg=11.6, UA- clear...  Daughter is a Engineer, civil (consulting) & took her to [employer] (we don't have note) who said it wasn't her back;  Then called requesting Neuro eval to r/o stroke> she saw DrWong 10/12 & his exam revealed some ataxia & decr sensation distally; MRI showed atrophy & sm vessel dis, no intracranial lesions (but by DrWong's review he felt she had a splenium infarction); MRA showed intracranial atherosclerotic changes in branch vessels esp in middle cerebral & post cerebral  distrib; MRA neck w/ 58% stenosis prox left ICA, & ectatic vertebrals w/ some narrowing;  He rec ASA 325mg /d + HomePT... He also requested 2DEcho done 10/12 w/ norm LVF/ wall motion/ & EF=55-60%, mod LAdil (45mm), neg bubble study...  They report that the PT has helped, she is ambulating w/ a rolling walker, she is taking the 325mg ASA...  HBP> on Aten50; BP= 126/60 w/o CP, palpit, SOB (but too sedentary), edema, etc...  Hyperlipid> on Simva40 & Lopid600Bid; FLP showed TChol 188, TG 251, HDL 42, LDL 95==> needs better low fat diet...  DM> on diet alone & labs showed FBS=112, A1c=6.2  Hypothyroid> on Synthroid75; TSH= 5.81 & reminded to take it everyday, if trend is up we will tweek dose later...  GERD/ IBS/ Polyps> on Dexilant60 & Librax prn; c/o gas & rec to add Simethacone preps prn- eg. Mylicon, Phazyme, etc...  GYN> she is c/o yeast infection & she is referred to her GYN for exam; they request Diflucan Rx (daugh is a Engineer, civil (consulting))- ok...  DJD/ LBP> on Disalcid750Bid per DrRendall; she is c/o neck pain and knee pain & DrRendall has done XRays & scans they tell me "it's normal wear & tear", using icey hot patches & spray as needed  HAs> on Fioricet prn & she insists on the "Teva blues" from her pharm...  Anxiety & Depression> on Zoloft100, Xanax0.25prn, Ambien10prn.  ~  February 23, 2012:  22mo ROV & Mary Alexander is here by herself today & notes that she is under lots of stress w/ husb's severe alz (can't toilet himself etc); she has recovered from her neuro event (?stroke) 9/12 on ASA 325mg /d w/ w/u as above from DrWong; PT has helped & now completed; she saw DrWong in f/u 4/13 w/ persistent gait abn, neck pain & imbalance> now w/ left sided HAs; he tried to replace her Ambien w/ Pamelor but she didn't like it & stopped on her own; she is on a lot of psychotropic meds for an 78y/o- Xanax, Zoloft, Ambien- but she states "I tolerate them just fine" & wants to continue her current meds the same...    We reviewed  prob list, meds, xrays and labs> see below>> LABS 6/13:  FLP- at goals on Simva+Gemgib;  BMet- ok w/ BS=105 A1c=6.2   Problem List:  ALLERGIC RHINITIS (ICD-477.9) - on ALLEGRA 180mg /d just Prn now... Hx of SINUSITIS (ICD-473.9) ASTHMATIC BRONCHITIS, ACUTE (ICD-466.0) - no recent exas etc...  ~  CXR 9/12 showed normal heart size, clear lungs, NAD...  HYPERTENSION (ICD-401.9) - controlled on ATENOLOL 50mg /d, and ASA 325mg /d now... ~  baseline EKG shows NSR, NSSTTWA... ~  NuclearStressTest 8/03 showed ? mild ischemia apical/ lat wall, EF=69%... referred to [facility]- ~  cath 8/03 showed normal coronaries... ~  2DEcho 10/12 showed norm LVF/ wall motion/ & EF=55-60%, mod LAdil (45mm), neg bubble study. ~  12/12:  BP=126/60 here today & similar at home... denies visual changes, CP, dizziness, syncope, dyspnea, edema, etc... ~  6/13:  BP= 126/60 again & she denies CP, palpit, ch in SOB, edema, etc...  CHEST PAIN, ATYPICAL (ICD-786.59) & PALPITATIONS (ICD-785.1) - eval 10/09 by Walker Kehr w/ repeat Myoview= neg, no infarct or ischemia, EF= 81%...  she denies recent symptoms.  HYPERCHOLESTEROLEMIA (ICD-272.0) - on ZOCOR 40mg /d & LOPID 600Bid...  ~  FLP 8/08 showed TChol 167, TG 127, HDL 38, LDL 103... ~  FLP 2/09 showed TChol 208, TG 228, HDL 39, LDL 118... continue same med, better diet+ exercise. ~  FLP 8/09 showed TChol 205, TG 187, HDL 40, LDL 124... rec to take meds regularly & repeat. ~  FLP 12/09 on Simva20+LopidBid  showed TChol 218, TG 166, HDL 37, LDL 134... rec> incr Simva40. ~  FLP 6/10 on Simva40+LopidBid showed TChol 187, TG, 129, HDL 48, LDL 113 ~  FLP 12/10 on same showed TChol 182, TG 177, HDL 43, LDL 104... contin Rx, better diet. ~  FLP 6/11 showed TChol 184, TG 158, HDL 41, LDL 111 ~  FLP 12/11 showed TChol 221, TG 156, HDL 39, LDL 144... needs same med, much better diet! ~  FLP 6/12 showed TChol 190, TG 118, HDL 44, LDL 123... Improved  ~  FLP 12/12 on Simva40+Gemfib600Bid  showed TChol 188, TG 251, HDL 42, LDL 95... Rec> better low fat diet... ~  FLP 6/13 on Simva40+Gemfib600Bid showed TChol 167, TG 142, HDL 46, LDL 92  DIABETES MELLITUS, BORDERLINE (ICD-790.29) - on diet alone... ~  labs prev w/ FBS betw 125 - 145... ~  labs 8/09 showed BS= 125, HgA1c= 6.6 on diet therapy... ~  labs 12/09 showed BS= 112, A1c= 6.3.Marland Kitchen. rec> contin diet rx. ~  labs 12/10 showed BS= 114, A1c= 6.3.Marland KitchenMarland Kitchen continue diet rx. ~  labs 12/11 showed BS= 102, A1c= 6.1 ~  Labs 6/12 showed BS= 119, A1c= 6.4.Marland KitchenMarland Kitchen Continue diet efforts. ~  Labs 12/12 on diet alone showed FBS=112, A1c=6.2 ~  Labs 6/13 on diet alone showed FBS=105, A1c=6.2  HYPOTHYROIDISM (ICD-244.9) - on SYNTHROID 52mcg/d... She is reminded to bring med bottles to office! ~  TSH 8/08 was 7.04 on Synthroid 60mcg/d, therefore increased to 93mcg/d... ~  TSH 2/09 on 56mcg/d was 0.34... keep same... ~  labs 8/09 showed TSH= 4.02 ~  labs 12/09 showed TSH= 4.23 ~  labs 12/10 on Levothy75 showed TSH= 1.24 ~  labs 5/11 on Levothy75 showed TSH= 2.37 ~  Labs 6/12 on Levothy75 showed TSH= 5.46... Reminded to take it every day. ~  Labs 12/12 on Levothy75 showed TSH= 5.81... May need incr dose to 88...  GERD (ICD-530.81) - Protonix changed to DEXILANT 60mg /d & improved per pt...  ~  last EGD was 11/05 showing gastritis... rec for PPI Bid indefinitely... ~  8/09: c/o incr nocturnal reflux symptoms- rec> change Prevacid to Protonix, add Reglan 10mg Qhs, elevate HOB, avoid caffeine, etc... ~  10/10: saw DrStark w/ incr GERD- changed to DEXILANT 60mg /d which helped- planned EGD (not done).  COLONIC POLYPS (ICD-211.3) & IRRITABLE BOWEL SYNDROME (ICD-564.1) - she requests LIBRAX but doesn't want the generic "yellow" caps, she wants the non-generic "blue" caps... ~  last colonoscopy was 8/06 by DrStark showing normal except for hems... f/u planned 65yrs. ~  sched for colonoscopy 11/10 (due to blood in stool) but she refused due to prep  required...  DEGENERATIVE JOINT DISEASE (ICD-715.90) & BACK PAIN, LUMBAR (ICD-724.2) - she is off the  Etodolac & Flexeril... off prev Mobic & Tramadol... she takes SALSALATE 750mg  Prn from DrRendall + calcium and vits...  Dr Priscille Kluver referrred her to [facility] for shots in back- she reports improvement.  STROKE, Cerebrovasc Disease >> she went to the ER 9/12 w/ CC weakness, ?back pain- ?felt like she couldn't walk although exam was neg & non-focal; CTBrain w/ atrophy, sm vessel dis, no acute changes; CXR- clear; EKG- SBrady 58/min, LAD, 1st deg AVB, NSSTTWA; LABS- normal, BS=134, Hg=11.6, UA- clear...  Daughter is a Engineer, civil (consulting) & took her to [employer] (we don't have note) who said it wasn't her back;  Then called requesting Neuro eval to r/o stroke> she saw DrWong 10/12 & his exam revealed some ataxia & decr sensation distally; MRI showed atrophy & sm vessel dis, no intracranial lesions (but by DrWong's review he felt she had a splenium infarction); MRA showed intracranial atherosclerotic changes in branch vessels esp in middle cerebral & post cerebral distrib; MRA neck w/ 58% stenosis prox left ICA, & ectatic vertebrals w/ some narrowing;  He rec ASA 325mg /d + HomePT... He also requested 2DEcho done 10/12 w/ norm LVF/ wall motion/ & EF=55-60%, mod LAdil (45mm), neg bubble study...  They report that the PT has helped, she is ambulating w/ a rolling walker, she is taking the 325mg ASA... ~  She has a gait abn & some imbalance; DrWong tried to switch Ambien to [employer] but she didn't tol the latter she says & stopped it on her own...  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  ANXIETY (ICD-300.00) - she was taking both Librium10 & Xanax 0.25mg ... she prefers the ALPRAZOLAM 0.25mg  Tid Prn... now on ZOLOFT 100mg /d & AMBIEN 10mg  Qhs for sleep.  Health Maintenance - GYN = DrLomax...  Urology = DrRDavis...  she had Shingles vaccine 6/09 at [facility]... she had PNEUMOVAX 2/09 here... she had the seasonal flu shot 10/09, and the 2010 Flu vaccine...   Past Surgical History  Procedure Date  . Vesicovaginal fistula closure w/ tah   . Polypectomy   . Cataract extraction     Outpatient Encounter Prescriptions as of 02/23/2012  Medication Sig Dispense Refill  . ALPRAZolam (XANAX) 0.25 MG tablet take 1 tablet by mouth twice a day if needed  60 tablet  5  . aspirin 325 MG tablet Take 325 mg by mouth daily.      Marland Kitchen atenolol (TENORMIN) 50 MG tablet TAKE 1 TABLET BY MOUTH ONCE DAILY  30 tablet  10  . butalbital-acetaminophen-caffeine (FIORICET, ESGIC) 50-325-40 MG per tablet take 1 tablet by mouth every 6 hours if needed  14 tablet  5  . Calcium Carbonate-Vitamin D (CALTRATE 600+D) 600-400 MG-UNIT per tablet Take 1 tablet by mouth daily.        . clidinium-chlordiazePOXIDE (LIBRAX) 2.5-5 MG per capsule Take 1 capsule by mouth 3 (three) times daily as needed.  90 capsule  3  . DEXILANT 60 MG capsule take 1 capsule by mouth once daily -30 MIN BEFORE THE 1ST MEAL OF THE DAY  30 capsule  5  . Diphenhyd-Hydrocort-Nystatin (FIRST-DUKES MOUTHWASH) SUSP 1 tsp gargle and swallow four times daily as needed  120 mL  2  . gemfibrozil (LOPID) 600 MG tablet TAKE 1 TABLET BY MOUTH TWICE A DAY  60 tablet  5  . levothyroxine (SYNTHROID, LEVOTHROID) 75 MCG tablet TAKE 1 TABLET BY MOUTH ONCE DAILY  30 tablet  11  . Multiple Vitamin (MULTIVITAMIN) capsule Take 1 capsule by mouth daily.        Marland Kitchen nystatin-triamcinolone ointment (MYCOLOG) APPLY TO AFFECTED AREA 2 TO 3 TIMES A DAY FOR UP TO 2 WEEKS, THEN USE 1 TO 3 TIMES A WEEK.  30 g  PRN  . salsalate (DISALCID) 750 MG tablet Take 750 mg by mouth 2 (two) times daily. As directed by Dr. Priscille Kluver       . sertraline (ZOLOFT) 100 MG tablet Take 1 tablet (100 mg total) by mouth daily.  30 tablet  11  . simvastatin (ZOCOR) 40 MG tablet take 1 tablet by mouth at bedtime  30 tablet  5  . zolpidem (AMBIEN) 10 MG tablet take 1  tablet by mouth at bedtime if needed for sleep  30 tablet  5  . nitroGLYCERIN (NITROSTAT) 0.4 MG SL tablet Place 1 tablet (0.4 mg total) under the tongue every 5 (five) minutes as needed for chest pain.  100 tablet  3  . DISCONTD: fluconazole (DIFLUCAN) 100 MG tablet take 1 tablet by mouth once daily  4 tablet  0  . DISCONTD: fluconazole (DIFLUCAN) 100 MG tablet Take 2 today then 1 daily until gone  6 tablet  0  . DISCONTD: nortriptyline (PAMELOR) 10 MG capsule take 1 before bed for 1 week, then increase to two tabs  30 capsule  2    Allergies  Allergen Reactions  . Penicillins     REACTION: yeast infection    Current Medications, Allergies, Past Medical History, Past Surgical History, Family History, and Social History were reviewed in [employer] record.    Review of Systems        See HPI - all other systems neg except as noted... The patient complains of dyspnea on exertion, headaches, severe indigestion/heartburn, muscle weakness, difficulty walking, and depression.  The patient denies anorexia, fever, weight loss, weight gain, vision loss, decreased hearing, hoarseness, chest pain, syncope, peripheral edema, prolonged cough, hemoptysis, abdominal pain, melena, hematochezia, hematuria, incontinence, suspicious skin lesions,  transient blindness, unusual weight change, abnormal bleeding, enlarged lymph nodes, and angioedema.    Objective:   Physical Exam     WD, WN, 78 y/o WF in NAD... GENERAL:  Alert & oriented; pleasant & cooperative... HEENT:  [place]/AT, EOM-wnl, PERRLA, EACs-clear, TMs-wnl, NOSE-clear, THROAT-clear & wnl. NECK:  Supple w/ fairROM; no JVD; normal carotid impulses w/o bruits; no thyromegaly or nodules palpated; no lymphadenopathy. CHEST:  Clear to P & A; without wheezes/ rales/ or rhonchi. HEART:  Regular Rhythm; without murmurs/ rubs/ or gallops. ABDOMEN:  Soft & nontender; normal bowel sounds; no organomegaly or masses detected. EXT: without  deformities, mildarthritic changes; no varicose veins/ venous insuffic/ or edema. NEURO:  CN's intact; no focal neuro deficits other than gait abn... DERM:  No lesions noted; no rash etc...  RADIOLOGY DATA:  Reviewed in the EPIC EMR & discussed w/ the patient...  LABORATORY DATA:  Reviewed in the EPIC EMR & discussed w/ the patient...   Assessment & Plan:   HBP>  Controlled on Atenolol, continue same...  CHOL>  On Simva40 + Gemfib Bid; FLP is reviewed & improved> she needs a better low fat diet!!!  DM>  Stable on diet alone & we reviewed low carb restriction etc...  HYPOTHY>  There is some confusion about her Levothy dose- 75, ?88, and she is reminded these questions are easily answered if she brings all her med bottles to every visit... Keep same for now & bring bottles on return...  GI>  GERD/ IBS/ Polyps>  Continue Dexilant, Librax, etc...  DJD/ LBP>  She continues on Disalcid from DrRendall...  Neuro> ?TIA, ?Stroke>  See above, eval per DrWong & he has rec PT/OT (it is helping) and ASA 325mg /d...  HA/ Anxiety/ Insomnia/ etc>  She requests refills for her chronic meds including: Fioricet, Alprazolam, Zoloft, Ambien...   Patient's Medications  New Prescriptions   No medications on file  Previous Medications   ALPRAZOLAM (XANAX) 0.25 MG TABLET    take 1 tablet by mouth twice a day if needed   ASPIRIN 325 MG TABLET    Take 325 mg by mouth daily.   ATENOLOL (TENORMIN) 50 MG TABLET    TAKE 1 TABLET BY MOUTH ONCE DAILY   BUTALBITAL-ACETAMINOPHEN-CAFFEINE (FIORICET, ESGIC) 50-325-40 MG PER TABLET    take 1 tablet by mouth every 6 hours if needed   CALCIUM CARBONATE-VITAMIN D (CALTRATE 600+D) 600-400 MG-UNIT PER TABLET    Take 1 tablet by mouth daily.     CLIDINIUM-CHLORDIAZEPOXIDE (LIBRAX) 2.5-5 MG PER CAPSULE    Take 1 capsule by mouth 3 (three) times daily as needed.   DEXILANT 60 MG CAPSULE    take 1 capsule by mouth once daily -30 MIN BEFORE THE 1ST MEAL OF THE DAY    DIPHENHYD-HYDROCORT-NYSTATIN (FIRST-DUKES MOUTHWASH) SUSP    1 tsp gargle and swallow four times daily as needed   GEMFIBROZIL (LOPID) 600 MG TABLET    TAKE 1 TABLET BY MOUTH TWICE A DAY   LEVOTHYROXINE (SYNTHROID, LEVOTHROID) 75 MCG TABLET    TAKE 1 TABLET BY MOUTH ONCE DAILY   MULTIPLE VITAMIN (MULTIVITAMIN) CAPSULE    Take 1 capsule by mouth daily.     NITROGLYCERIN (NITROSTAT) 0.4 MG SL TABLET    Place 1 tablet (0.4 mg total) under the tongue every 5 (five) minutes as needed for chest pain.   NYSTATIN-TRIAMCINOLONE OINTMENT (MYCOLOG)    APPLY TO AFFECTED AREA 2 TO 3 TIMES A DAY FOR UP TO 2 WEEKS, THEN USE 1  TO 3 TIMES A WEEK.   SALSALATE (DISALCID) 750 MG TABLET    Take 750 mg by mouth 2 (two) times daily. As directed by Dr. Priscille Kluver    SERTRALINE (ZOLOFT) 100 MG TABLET    Take 1 tablet (100 mg total) by mouth daily.   SIMVASTATIN (ZOCOR) 40 MG TABLET    take 1 tablet by mouth at bedtime   ZOLPIDEM (AMBIEN) 10 MG TABLET    take 1 tablet by mouth at bedtime if needed for sleep  Modified Medications   No medications on file  Discontinued Medications   FLUCONAZOLE (DIFLUCAN) 100 MG TABLET    take 1 tablet by mouth once daily   FLUCONAZOLE (DIFLUCAN) 100 MG TABLET    Take 2 today then 1 daily until gone   NORTRIPTYLINE (PAMELOR) 10 MG CAPSULE    take 1 before bed for 1 week, then increase to two tabs

## 2012-02-25 NOTE — Patient Instructions (Signed)
Today we updated your med list in our EPIC system...    Continue your current medications the same...  Call for any questions...  Continue your home therapy...  Let's plan a routine follow up visit in 6 months.Marland KitchenMarland Kitchen

## 2012-02-29 ENCOUNTER — Telehealth: Payer: Self-pay | Admitting: Pulmonary Disease

## 2012-02-29 ENCOUNTER — Ambulatory Visit (INDEPENDENT_AMBULATORY_CARE_PROVIDER_SITE_OTHER): Payer: Medicare Other | Admitting: Neurology

## 2012-02-29 ENCOUNTER — Encounter: Payer: Self-pay | Admitting: Neurology

## 2012-02-29 VITALS — BP 118/70 | HR 64 | Wt 126.0 lb

## 2012-02-29 DIAGNOSIS — R269 Unspecified abnormalities of gait and mobility: Secondary | ICD-10-CM

## 2012-02-29 NOTE — Progress Notes (Signed)
Dear Dr. Kriste Alexander,   I saw Mary Alexander back in [facility] for her problem with her problem walking and headaches. As you know she is an 78 year old woman with a history of hyperlipidemia, hypertension and degenerative disease of the spine who with complaints of sudden difficulty walking on September 18th. She felt like she couldn't feel her legs. She denies weakness in the arms, dizziness or vertigo, dysphagia or dysarthria or changes in her vision.  She was seen initially in the ED and was sent home without a diagnosis. A CT head was unremarkable. She saw her orthopedist who follows her degenerative back disease and he schedule an EMG/NCS.  She has had gradual improvement in her symptoms but still needs assistance to walk.  I felt her presentation was most consistent with a ischemic stroke, likely involving her cerebellum. A MRI brain revealed mild to moderate small vessel disease and mild atrophy with no signs of chronic strokes - however I felt she had a splenium infarction as well on MRI, and this was verified by neurorads. MRA head did reveal some mild to moderate atherosclerosis in both the MCA and PCAs. She was also noted to have a 60% stenosis of the left ICA.  Imbalance continues, continues to have neck pain, shoots up into the head, got worse in the past few weeks. Worse on the commode.   Also, had tinnitus in left ear, and now headaches in left side of headache. Had history of headaches. No jaw claudication. Taking fioricet 1-2 times per month. Her headaches have increased in frequency, she thinks secondary to stress. Tinnitus is improving with no treatment, not pulsatile.  For the headaches I started her on Pamelor at her last visit. She did not take this.  However, her headaches have attenuated.  Her walking has improved a little.  She continues to struggle with taking care of her husband who has Mary Alexander's.  Luckily she is living with her daughter and the family works  together to take care of him.  Unfortunately she does not get many occasions where she leaves the house.   Medical history, social history, and family history were reviewed and have not changed since the last clinic visit.  Current Outpatient Prescriptions on File Prior to Visit  Medication Sig Dispense Refill  . ALPRAZolam (XANAX) 0.25 MG tablet take 1 tablet by mouth twice a day if needed  60 tablet  5  . aspirin 325 MG tablet Take 325 mg by mouth daily.      Marland Kitchen atenolol (TENORMIN) 50 MG tablet TAKE 1 TABLET BY MOUTH ONCE DAILY  30 tablet  10  . butalbital-acetaminophen-caffeine (FIORICET, ESGIC) 50-325-40 MG per tablet take 1 tablet by mouth every 6 hours if needed  14 tablet  5  . Calcium Carbonate-Vitamin D (CALTRATE 600+D) 600-400 MG-UNIT per tablet Take 1 tablet by mouth daily.        . clidinium-chlordiazePOXIDE (LIBRAX) 2.5-5 MG per capsule Take 1 capsule by mouth 3 (three) times daily as needed.  90 capsule  3  . DEXILANT 60 MG capsule take 1 capsule by mouth once daily -30 MIN BEFORE THE 1ST MEAL OF THE DAY  30 capsule  5  . Diphenhyd-Hydrocort-Nystatin (FIRST-DUKES MOUTHWASH) SUSP 1 tsp gargle and swallow four times daily as needed  120 mL  2  . gemfibrozil (LOPID) 600 MG tablet TAKE 1 TABLET BY MOUTH TWICE A DAY  60 tablet  5  . levothyroxine (SYNTHROID, LEVOTHROID) 75 MCG tablet  TAKE 1 TABLET BY MOUTH ONCE DAILY  30 tablet  11  . Multiple Vitamin (MULTIVITAMIN) capsule Take 1 capsule by mouth daily.        Marland Kitchen nystatin-triamcinolone ointment (MYCOLOG) APPLY TO AFFECTED AREA 2 TO 3 TIMES A DAY FOR UP TO 2 WEEKS, THEN USE 1 TO 3 TIMES A WEEK.  30 g  PRN  . salsalate (DISALCID) 750 MG tablet Take 750 mg by mouth 2 (two) times daily. As directed by Dr. Priscille Kluver       . sertraline (ZOLOFT) 100 MG tablet Take 1 tablet (100 mg total) by mouth daily.  30 tablet  11  . simvastatin (ZOCOR) 40 MG tablet take 1 tablet by mouth at bedtime  30 tablet  5  . zolpidem (AMBIEN) 10 MG tablet take 1  tablet by mouth at bedtime if needed for sleep  30 tablet  5  . nitroGLYCERIN (NITROSTAT) 0.4 MG SL tablet Place 1 tablet (0.4 mg total) under the tongue every 5 (five) minutes as needed for chest pain.  100 tablet  3    Allergies  Allergen Reactions  . Penicillins     REACTION: yeast infection    ROS:  13 systems were reviewed and  are unremarkable.  Exam: . Filed Vitals:   02/29/12 1038  BP: 118/70  Pulse: 64  Weight: 126 lb (57.153 kg)    In general, well appearing women.    Motor:  Normal strength.  Reflexes:  2+ thoughout  Coordination:  Normal finger to nose  Gait:  Slow gait, tentative, mildly wide based but stable.  Turns slowly.  Romberg -  Impression/Recommendations:  1.  Gait ataxia after posterior splenium stroke - improved.  Probably as good as it is going to get. 2.  Headaches - improved, unlikely to be due to serious pathology.  Mary Raider Modesto Charon, MD [facility], [place]

## 2012-02-29 NOTE — Telephone Encounter (Signed)
Notes Recorded by Michele Mcalpine, MD on 02/24/2012 at 8:43 AM Please notify patient>  FLP looks good on Simva40 + LopidBid> continue same... Chems- ok, sugar looks good on diet alone...   I spoke with patient about results and she verbalized understanding and had no questions

## 2012-03-07 ENCOUNTER — Other Ambulatory Visit: Payer: Self-pay | Admitting: Pulmonary Disease

## 2012-04-05 ENCOUNTER — Other Ambulatory Visit: Payer: Self-pay | Admitting: Pulmonary Disease

## 2012-04-06 ENCOUNTER — Telehealth: Payer: Self-pay | Admitting: Pulmonary Disease

## 2012-04-06 NOTE — Telephone Encounter (Signed)
Pt's daughter says the pt pulled a tick from her head last weekend and it was engorged. She's not sure if she removed the whole tick but also states the tick crawled up her finger once out. Pt has had general malaise and just not feeling well all week and would like recs from SN. Pls advise. Allergies  Allergen Reactions  . Penicillins     REACTION: yeast infection

## 2012-04-06 NOTE — Telephone Encounter (Signed)
Sn has left the office for the day. Pt's daughter aware pt should be seen or evaluated at urgent care and ask that records be sent to [facility]. Daughter verbalized understanding.

## 2012-04-21 ENCOUNTER — Other Ambulatory Visit: Payer: Self-pay | Admitting: Pulmonary Disease

## 2012-04-25 ENCOUNTER — Other Ambulatory Visit: Payer: Self-pay | Admitting: Pulmonary Disease

## 2012-04-25 MED ORDER — FIRST-DUKES MOUTHWASH MT SUSP: OROMUCOSAL | Status: DC

## 2012-05-02 ENCOUNTER — Ambulatory Visit
Admission: RE | Admit: 2012-05-02 | Discharge: 2012-05-02 | Disposition: A | Discharge status: Home / Self Care | Payer: Medicare Other | Source: Ambulatory Visit | Attending: Pulmonary Disease | Admitting: Pulmonary Disease

## 2012-05-02 DIAGNOSIS — Z1231 Encounter for screening mammogram for malignant neoplasm of breast: Secondary | ICD-10-CM

## 2012-05-16 ENCOUNTER — Encounter: Payer: Self-pay | Admitting: Gastroenterology

## 2012-06-30 ENCOUNTER — Other Ambulatory Visit: Payer: Self-pay | Admitting: Pulmonary Disease

## 2012-07-17 ENCOUNTER — Telehealth: Payer: Self-pay | Admitting: Pulmonary Disease

## 2012-07-17 MED ORDER — FLUCONAZOLE 100 MG PO TABS: ORAL_TABLET | ORAL | Status: DC

## 2012-07-17 NOTE — Telephone Encounter (Signed)
Per SN--ok to call in diflucan 100 mg   #6  2 po today then 1 daily until gone.  i have called and spoke with pts daughter betsy and she is aware that this medication has been sent to the pharmacy.  Nothing further is needed.

## 2012-07-17 NOTE — Telephone Encounter (Signed)
Called and spoke with pt and she is requesting that diflucan be called in for the yeast infection.  Tamela Oddi stated that this is in her mouth and gums.  Tamela Oddi stated that her father was buried last Sunday and the pt has been having a difficult time.  SN please advise if ok to send in the diflucan.  Thanks  Allergies  Allergen Reactions  . Penicillins     REACTION: yeast infection

## 2012-07-20 ENCOUNTER — Other Ambulatory Visit: Payer: Self-pay | Admitting: Pulmonary Disease

## 2012-07-20 ENCOUNTER — Telehealth: Payer: Self-pay | Admitting: Pulmonary Disease

## 2012-07-20 NOTE — Telephone Encounter (Signed)
Called and spoke with pt  and she stated that the yeast infection has not cleared up and they are requesting another round of diflucan -- she stated that her mouth is still sore and around her teeth.  SN please advise if ok to send in diflucan again.  Thanks  Allergies  Allergen Reactions  . Penicillins     REACTION: yeast infection

## 2012-07-20 NOTE — Telephone Encounter (Signed)
Per SN---does not need more diflucan---the yeast cant live past the diflucan.  She will need to cont the MMW, brush her tongue daily when she brushes her teeth, and if she is not better on Monday then she will either need to call her dentist or follow up with ENT.  i spoke with pts daughter betsy and she is aware of SN recs.

## 2012-07-20 NOTE — Telephone Encounter (Signed)
Called daughter back and she stated that the pt has 1 tablet left for tomorrow and has been using the MMW as well.  She stated that the pts mouth is a little better but her tongue is still coated white, corners of her mouth hurt and her gums are hurting.  She stated that she can only eat soft foods.  Daughter is requesting another round of diflucan.  SN please advise. thanks

## 2012-07-23 ENCOUNTER — Telehealth: Payer: Self-pay | Admitting: Pulmonary Disease

## 2012-07-23 ENCOUNTER — Ambulatory Visit (INDEPENDENT_AMBULATORY_CARE_PROVIDER_SITE_OTHER): Payer: Medicare Other | Admitting: Pulmonary Disease

## 2012-07-23 VITALS — BP 118/80 | HR 68 | Temp 97.2°F | Ht 62.0 in | Wt 128.4 lb

## 2012-07-23 DIAGNOSIS — F411 Generalized anxiety disorder: Secondary | ICD-10-CM

## 2012-07-23 DIAGNOSIS — K130 Diseases of lips: Secondary | ICD-10-CM

## 2012-07-23 DIAGNOSIS — E7800 Pure hypercholesterolemia, unspecified: Secondary | ICD-10-CM

## 2012-07-23 DIAGNOSIS — K589 Irritable bowel syndrome without diarrhea: Secondary | ICD-10-CM

## 2012-07-23 DIAGNOSIS — I679 Cerebrovascular disease, unspecified: Secondary | ICD-10-CM

## 2012-07-23 DIAGNOSIS — K219 Gastro-esophageal reflux disease without esophagitis: Secondary | ICD-10-CM

## 2012-07-23 DIAGNOSIS — R51 Headache: Secondary | ICD-10-CM

## 2012-07-23 DIAGNOSIS — I10 Essential (primary) hypertension: Secondary | ICD-10-CM

## 2012-07-23 DIAGNOSIS — M545 Low back pain, unspecified: Secondary | ICD-10-CM

## 2012-07-23 DIAGNOSIS — M1990 Unspecified osteoarthritis, unspecified site: Secondary | ICD-10-CM

## 2012-07-23 DIAGNOSIS — I6350 Cerebral infarction due to unspecified occlusion or stenosis of unspecified cerebral artery: Secondary | ICD-10-CM

## 2012-07-23 DIAGNOSIS — E039 Hypothyroidism, unspecified: Secondary | ICD-10-CM

## 2012-07-23 DIAGNOSIS — I639 Cerebral infarction, unspecified: Secondary | ICD-10-CM

## 2012-07-23 MED ORDER — CLOTRIMAZOLE-BETAMETHASONE 1-0.05 % EX CREA: TOPICAL_CREAM | Freq: Two times a day (BID) | CUTANEOUS | Status: DC

## 2012-07-23 NOTE — Telephone Encounter (Signed)
[email or web address].Anaelle Dunton, CMA  

## 2012-07-23 NOTE — Telephone Encounter (Signed)
Per TD, Dr. Kriste Basque will see this pt today at 3:30. Morrie Sheldon made aware and is on her way to the office now.

## 2012-07-23 NOTE — Patient Instructions (Addendum)
Today we updated your med list in our EPIC system...    Continue your current medications the same...  For your Cheilosis (Angular cheilitis) we have prescribed Lotrisone cream (generic)>    Apply a small amt to the corner of the mouth several times daily as needed...    You may continue the Magic Mouthwash as needed...    Please remember to gently brush your tongue after brushing your teeth daily...  Continue your Aspirin, and increase your physical activity...  Call for any questions...   Let's change your December appt to one in Feb-Mar 2014 so we can get your FASTING blood work at that time.Marland KitchenMarland Kitchen

## 2012-07-23 NOTE — Telephone Encounter (Signed)
Spoke with Morrie Sheldon, pt's granddaughter.  Pt's husband passed x 2 wks ago.  Reports since then pt is have some slurred speech mainly in the evenings, a "bad" HA, is sleeping more, and c/o back, knees, and mouth hurting.  Using MMW for mouth pain with no relief.  Also, reports pt vomited x 3 nights ago but doesn't remember this and fell out of a chair x 2 days ago.

## 2012-07-23 NOTE — Telephone Encounter (Signed)
Pt's granddaughter,  Darletta Moll, returned triage's call & can be reached at [phone].  Antionette Fairy

## 2012-07-23 NOTE — Telephone Encounter (Signed)
done

## 2012-08-13 ENCOUNTER — Other Ambulatory Visit: Payer: Self-pay | Admitting: Pulmonary Disease

## 2012-08-19 ENCOUNTER — Encounter: Payer: Self-pay | Admitting: Pulmonary Disease

## 2012-08-19 NOTE — Progress Notes (Signed)
Subjective:    Patient ID: Mary Alexander, female    DOB: 10/17/1926, 78 y.o.   MRN: [id number]  HPI 78 y/o WF here for a follow up visit... she has mult somatic complaints & mult medical problems as below:  ~  Jun11:  states she's been doing "bad" w/ "yeast" found by DrLomax & DrRDavis- now resolved... she had BMD 5/11 by GYN w/ TScores -2.0 in [facility], but OK in other sites & DrLomax only rec Caltrate + MVI... she's been taking Mobic/ Tramadol Prn for arthritis pain, and increased the Zoloft to 100mg /d w/ improvement... labs done 5/11> all essentially WNL. ~  Dec11:  She & husb moved in w/ daugh... c/o headaches & insomnia... tried Seroquel w/o benefit she says & wants Ambien (took one of her daughter's & it helped)... also wants refill Fioricet for HAs & MMW for throat...  ~  February 21, 2011:  85mo ROV & she reports "same old thing" meaning that they are living w/ daugh since her husb is ill, getting worse, needs lots of help & stressful for her;  Requests Ambien refill to help her rest + her Fioricet for HAs, Alprazolam for nerves, and Zoloft for depression...  Fasting labs showed LDL 123, FBS 119, A1c 6.4, Hg 12.6, TSH 5.46> SEE PROB LIST below...  ~  August 25, 2011:  85mo ROV & she went to the ER 9/12 w/ CC weakness, ?back pain- ?felt like she couldn't walk although exam was neg & non-focal; CTBrain w/ atrophy, sm vessel dis, no acute changes; CXR- clear; EKG- SBrady 58/min, LAD, 1st deg AVB, NSSTTWA; LABS- normal, BS=134, Hg=11.6, UA- clear...  Daughter is a Engineer, civil (consulting) & took her to [employer] (we don't have note) who said it wasn't her back;  Then called requesting Neuro eval to r/o stroke> she saw DrWong 10/12 & his exam revealed some ataxia & decr sensation distally; MRI showed atrophy & sm vessel dis, no intracranial lesions (but by DrWong's review he felt she had a splenium infarction); MRA showed intracranial atherosclerotic changes in branch vessels esp in middle cerebral & post cerebral  distrib; MRA neck w/ 58% stenosis prox left ICA, & ectatic vertebrals w/ some narrowing;  He rec ASA 325mg /d + HomePT... He also requested 2DEcho done 10/12 w/ norm LVF/ wall motion/ & EF=55-60%, mod LAdil (45mm), neg bubble study...  They report that the PT has helped, she is ambulating w/ a rolling walker, she is taking the 325mg ASA...  HBP> on Aten50; BP= 126/60 w/o CP, palpit, SOB (but too sedentary), edema, etc...  Hyperlipid> on Simva40 & Lopid600Bid; FLP showed TChol 188, TG 251, HDL 42, LDL 95==> needs better low fat diet...  DM> on diet alone & labs showed FBS=112, A1c=6.2  Hypothyroid> on Synthroid75; TSH= 5.81 & reminded to take it everyday, if trend is up we will tweek dose later...  GERD/ IBS/ Polyps> on Dexilant60 & Librax prn; c/o gas & rec to add Simethacone preps prn- eg. Mylicon, Phazyme, etc...  GYN> she is c/o yeast infection & she is referred to her GYN for exam; they request Diflucan Rx (daugh is a Engineer, civil (consulting))- ok...  DJD/ LBP> on Disalcid750Bid per DrRendall; she is c/o neck pain and knee pain & DrRendall has done XRays & scans they tell me "it's normal wear & tear", using icey hot patches & spray as needed  HAs> on Fioricet prn & she insists on the "Teva blues" from her pharm...  Anxiety & Depression> on Zoloft100, Xanax0.25prn, Ambien10prn.  ~  February 23, 2012:  60mo ROV & Mary Alexander is here by herself today & notes that she is under lots of stress w/ husb's severe alz (can't toilet himself etc); she has recovered from her neuro event (?stroke) 9/12 on ASA 325mg /d w/ w/u as above from DrWong; PT has helped & now completed; she saw DrWong in f/u 4/13 w/ persistent gait abn, neck pain & imbalance> now w/ left sided HAs; he tried to replace her Ambien w/ Pamelor but she didn't like it & stopped on her own; she is on a lot of psychotropic meds for an 78y/o- Xanax, Zoloft, Ambien- but she states "I tolerate them just fine" & wants to continue her current meds the same...    We reviewed  prob list, meds, xrays and labs> see below>> LABS 6/13:  FLP- at goals on Simva+Gemgib;  BMet- ok w/ BS=105 A1c=6.2  ~  July 23, 2012:  24mo ROV & add-on for mult somatic complaints> Ha, nausea, ?slurred speech, tired/ sleepy, "mouth hurting" & MMW w/o help, gums sore & treated by Dentist w/ diflucan, having to wait on new teeth; she fell out of chair & memory poor; most of these problems occurred after husb passed away w/ incr stress for pt... We reviewed the following issues during today's OV>>    HBP> on ASA325, Aten50; BP=118/80 & she denies angina, palpit, ch in SOB (she is too sedentary), edema...    Chol> on Simva40, Lopid600Bid; last FLP 6/13 showed TChol 167, TG 142, HDL 46, LDL 92     Hypothy> on Synthroid75; last TSH=5.81 7 reminded to take med every day...    Cerebrovasc Dis, Stroke, HAs> on ASA325; she had neuro eval DrWang 6/13- gait abn, splenium infarct, mod cerebrovasc dis, tinnutus, HAs on Fioricet; I offered her PT but she declined "I'm walking OK" she says...    Anxiety> on Zoloft100, Alprazolam0.25, Ambien10...  We reviewed prob list, meds, xrays and labs> see below for updates >>    Problem List:  ALLERGIC RHINITIS (ICD-477.9) - on ALLEGRA 180mg /d just Prn now... Hx of SINUSITIS (ICD-473.9) ASTHMATIC BRONCHITIS, ACUTE (ICD-466.0) - no recent exas etc...  ~  CXR 9/12 showed normal heart size, clear lungs, NAD...  HYPERTENSION (ICD-401.9) - controlled on ATENOLOL 50mg /d, and ASA 325mg /d now... ~  baseline EKG shows NSR, NSSTTWA... ~  NuclearStressTest 8/03 showed ? mild ischemia apical/ lat wall, EF=69%... referred to [facility]- ~  cath 8/03 showed normal coronaries... ~  2DEcho 10/12 showed norm LVF/ wall motion/ & EF=55-60%, mod LAdil (45mm), neg bubble study. ~  12/12:  BP=126/60 here today & similar at home... denies visual changes, CP, dizziness, syncope, dyspnea, edema, etc... ~  6/13:  BP= 126/60 again & she denies CP, palpit, ch in SOB, edema, etc...  CHEST  PAIN, ATYPICAL (ICD-786.59) & PALPITATIONS (ICD-785.1) - eval 10/09 by Walker Kehr w/ repeat Myoview= neg, no infarct or ischemia, EF= 81%...  she denies recent symptoms.  HYPERCHOLESTEROLEMIA (ICD-272.0) - on ZOCOR 40mg /d & LOPID 600Bid...  ~  FLP 8/08 showed TChol 167, TG 127, HDL 38, LDL 103... ~  FLP 2/09 showed TChol 208, TG 228, HDL 39, LDL 118... continue same med, better diet+ exercise. ~  FLP 8/09 showed TChol 205, TG 187, HDL 40, LDL 124... rec to take meds regularly & repeat. ~  FLP 12/09 on Simva20+LopidBid  showed TChol 218, TG 166, HDL 37, LDL 134... rec> incr Simva40. ~  FLP 6/10 on Simva40+LopidBid showed TChol 187, TG, 129, HDL 48, LDL 113 ~  FLP 12/10 on same showed TChol 182, TG 177, HDL 43, LDL 104... contin Rx, better diet. ~  FLP 6/11 showed TChol 184, TG 158, HDL 41, LDL 111 ~  FLP 12/11 showed TChol 221, TG 156, HDL 39, LDL 144... needs same med, much better diet! ~  FLP 6/12 showed TChol 190, TG 118, HDL 44, LDL 123... Improved  ~  FLP 12/12 on Simva40+Gemfib600Bid showed TChol 188, TG 251, HDL 42, LDL 95... Rec> better low fat diet... ~  FLP 6/13 on Simva40+Gemfib600Bid showed TChol 167, TG 142, HDL 46, LDL 92  DIABETES MELLITUS, BORDERLINE (ICD-790.29) - on diet alone... ~  labs prev w/ FBS betw 125 - 145... ~  labs 8/09 showed BS= 125, HgA1c= 6.6 on diet therapy... ~  labs 12/09 showed BS= 112, A1c= 6.3.Marland Kitchen. rec> contin diet rx. ~  labs 12/10 showed BS= 114, A1c= 6.3.Marland KitchenMarland Kitchen continue diet rx. ~  labs 12/11 showed BS= 102, A1c= 6.1 ~  Labs 6/12 showed BS= 119, A1c= 6.4.Marland KitchenMarland Kitchen Continue diet efforts. ~  Labs 12/12 on diet alone showed FBS=112, A1c=6.2 ~  Labs 6/13 on diet alone showed FBS=105, A1c=6.2  HYPOTHYROIDISM (ICD-244.9) - on SYNTHROID 63mcg/d... She is reminded to bring med bottles to office! ~  TSH 8/08 was 7.04 on Synthroid 82mcg/d, therefore increased to 84mcg/d... ~  TSH 2/09 on 75mcg/d was 0.34... keep same... ~  labs 8/09 showed TSH= 4.02 ~  labs 12/09 showed  TSH= 4.23 ~  labs 12/10 on Levothy75 showed TSH= 1.24 ~  labs 5/11 on Levothy75 showed TSH= 2.37 ~  Labs 6/12 on Levothy75 showed TSH= 5.46... Reminded to take it every day. ~  Labs 12/12 on Levothy75 showed TSH= 5.81... May need incr dose to 88...  GERD (ICD-530.81) - Protonix changed to DEXILANT 60mg /d & improved per pt...  ~  last EGD was 11/05 showing gastritis... rec for PPI Bid indefinitely... ~  8/09: c/o incr nocturnal reflux symptoms- rec> change Prevacid to Protonix, add Reglan 10mg Qhs, elevate HOB, avoid caffeine, etc... ~  10/10: saw DrStark w/ incr GERD- changed to DEXILANT 60mg /d which helped- planned EGD (not done).  COLONIC POLYPS (ICD-211.3) & IRRITABLE BOWEL SYNDROME (ICD-564.1) - she requests LIBRAX but doesn't want the generic "yellow" caps, she wants the non-generic "blue" caps... ~  last colonoscopy was 8/06 by DrStark showing normal except for hems... f/u planned 37yrs. ~  sched for colonoscopy 11/10 (due to blood in stool) but she refused due to prep required...  DEGENERATIVE JOINT DISEASE (ICD-715.90) & BACK PAIN, LUMBAR (ICD-724.2) - she is off the  Etodolac & Flexeril... off prev Mobic & Tramadol... she takes SALSALATE 750mg  Prn from DrRendall + calcium and vits...  Dr Priscille Kluver referrred her to [facility] for shots in back- she reports improvement.  STROKE, Cerebrovasc Disease >> she went to the ER 9/12 w/ CC weakness, ?back pain- ?felt like she couldn't walk although exam was neg & non-focal; CTBrain w/ atrophy, sm vessel dis, no acute changes; CXR- clear; EKG- SBrady 58/min, LAD, 1st deg AVB, NSSTTWA; LABS- normal, BS=134, Hg=11.6, UA- clear...  Daughter is a Engineer, civil (consulting) & took her to [employer] (we don't have note) who said it wasn't her back;  Then called requesting Neuro eval to r/o stroke> she saw DrWong 10/12 & his exam revealed some ataxia & decr sensation distally; MRI showed atrophy & sm vessel dis, no intracranial lesions (but by DrWong's review he felt she had a  splenium infarction); MRA showed intracranial atherosclerotic changes in branch vessels esp  in middle cerebral & post cerebral distrib; MRA neck w/ 58% stenosis prox left ICA, & ectatic vertebrals w/ some narrowing;  He rec ASA 325mg /d + HomePT... He also requested 2DEcho done 10/12 w/ norm LVF/ wall motion/ & EF=55-60%, mod LAdil (45mm), neg bubble study...  They report that the PT has helped, she is ambulating w/ a rolling walker, she is taking the 325mg ASA... ~  She has a gait abn & some imbalance; DrWong tried to switch Ambien to [employer] but she didn't tol the latter she says & stopped it on her own...  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  ANXIETY (ICD-300.00) - she was taking both Librium10 & Xanax 0.25mg ... she prefers the ALPRAZOLAM 0.25mg  Tid Prn... now on ZOLOFT 100mg /d & AMBIEN 10mg  Qhs for sleep.  Health Maintenance - GYN = DrLomax...  Urology = DrRDavis...  she had Shingles vaccine 6/09 at [facility]... she had PNEUMOVAX 2/09 here... she had the seasonal flu shot 10/09, and the 2010 Flu vaccine...   Past Surgical History  Procedure Date  . Vesicovaginal fistula closure w/ tah   . Polypectomy   . Cataract extraction     Outpatient Encounter Prescriptions as of 07/23/2012  Medication Sig Dispense Refill  . ALPRAZolam (XANAX) 0.25 MG tablet take 1 tablet by mouth twice a day if needed  60 tablet  5  . aspirin 325 MG tablet Take 325 mg by mouth daily.      Marland Kitchen atenolol (TENORMIN) 50 MG tablet TAKE 1 TABLET BY MOUTH ONCE DAILY  30 tablet  10  . butalbital-acetaminophen-caffeine (FIORICET, ESGIC) 50-325-40 MG per tablet take 1 tablet by mouth every 6 hours if needed  14 tablet  5  . Calcium Carbonate-Vitamin D (CALTRATE 600+D) 600-400 MG-UNIT per tablet Take 1 tablet by mouth daily.        Marland Kitchen DEXILANT 60 MG capsule take 1 capsule by mouth once daily -30 MIN BEFORE THE 1ST MEAL OF THE DAY  30 capsule  5  .  Diphenhyd-Hydrocort-Nystatin (FIRST-DUKES MOUTHWASH) SUSP 1 tsp gargle and swallow four times daily as needed  120 mL  5  . gemfibrozil (LOPID) 600 MG tablet TAKE 1 TABLET BY MOUTH TWICE A DAY  60 tablet  5  . Multiple Vitamin (MULTIVITAMIN) capsule Take 1 capsule by mouth daily.        Marland Kitchen nystatin-triamcinolone ointment (MYCOLOG) APPLY TO AFFECTED AREA 2 TO 3 TIMES A DAY FOR UP TO 2 WEEKS, THEN USE 1 TO 3 TIMES A WEEK.  30 g  PRN  . salsalate (DISALCID) 750 MG tablet Take 750 mg by mouth 2 (two) times daily. As directed by Dr. Priscille Kluver       . sertraline (ZOLOFT) 100 MG tablet take 1 tablet by mouth once daily  30 tablet  11  . simvastatin (ZOCOR) 40 MG tablet take 1 tablet by mouth at bedtime  30 tablet  5  . zolpidem (AMBIEN) 10 MG tablet take 1 tablet by mouth at bedtime if needed for sleep  30 tablet  5  . [DISCONTINUED] clidinium-chlordiazePOXIDE (LIBRAX) 2.5-5 MG per capsule take 1 capsule by mouth three times a day if needed  90 capsule  3  . [DISCONTINUED] levothyroxine (SYNTHROID, LEVOTHROID) 75 MCG tablet TAKE 1 TABLET BY MOUTH ONCE DAILY  30 tablet  11  . clotrimazole-betamethasone (LOTRISONE) cream Apply topically 2 (two) times daily. Use as directed  30 g  0  . nitroGLYCERIN (  NITROSTAT) 0.4 MG SL tablet Place 1 tablet (0.4 mg total) under the tongue every 5 (five) minutes as needed for chest pain.  100 tablet  3  . zolpidem (AMBIEN) 10 MG tablet Take 1 tablet (10 mg total) by mouth at bedtime as needed for sleep.  30 tablet  5  . [DISCONTINUED] fluconazole (DIFLUCAN) 100 MG tablet Take 2 today then 1 daily until gone  6 tablet  0    Allergies  Allergen Reactions  . Penicillins     REACTION: yeast infection    Current Medications, Allergies, Past Medical History, Past Surgical History, Family History, and Social History were reviewed in [employer] record.    Review of Systems        See HPI - all other systems neg except as noted... The patient complains  of dyspnea on exertion, headaches, severe indigestion/heartburn, muscle weakness, difficulty walking, and depression.  The patient denies anorexia, fever, weight loss, weight gain, vision loss, decreased hearing, hoarseness, chest pain, syncope, peripheral edema, prolonged cough, hemoptysis, abdominal pain, melena, hematochezia, hematuria, incontinence, suspicious skin lesions, transient blindness, unusual weight change, abnormal bleeding, enlarged lymph nodes, and angioedema.    Objective:   Physical Exam     WD, WN, 78 y/o WF in NAD... GENERAL:  Alert & oriented; pleasant & cooperative... HEENT:  [place]/AT, EOM-wnl, PERRLA, EACs-clear, TMs-wnl, NOSE-clear, THROAT-clear & wnl. NECK:  Supple w/ fairROM; no JVD; normal carotid impulses w/o bruits; no thyromegaly or nodules palpated; no lymphadenopathy. CHEST:  Clear to P & A; without wheezes/ rales/ or rhonchi. HEART:  Regular Rhythm; without murmurs/ rubs/ or gallops. ABDOMEN:  Soft & nontender; normal bowel sounds; no organomegaly or masses detected. EXT: without deformities, mildarthritic changes; no varicose veins/ venous insuffic/ or edema. NEURO:  CN's intact; no focal neuro deficits other than gait abn... DERM:  No lesions noted; no rash etc...  RADIOLOGY DATA:  Reviewed in the EPIC EMR & discussed w/ the patient...  LABORATORY DATA:  Reviewed in the EPIC EMR & discussed w/ the patient...   Assessment & Plan:    HBP>  Controlled on Atenolol, continue same...  CHOL>  On Simva40 + Gemfib Bid; FLP is reviewed & improved> she needs a better low fat diet!!!  DM>  Stable on diet alone & we reviewed low carb restriction etc...  HYPOTHY>  There is some confusion about her Levothy dose- 75, ?88, and she is reminded these questions are easily answered if she brings all her med bottles to every visit... Keep same for now & bring bottles on return...  GI>  GERD/ IBS/ Polyps>  Continue Dexilant, Librax, etc...  DJD/ LBP>  She continues on  Disalcid from DrRendall...  Neuro> ?TIA, ?Stroke>  See above, eval per DrWong & he has rec PT/OT (it is helping) and ASA 325mg /d...  HA/ Anxiety/ Insomnia/ etc>  She requests refills for her chronic meds including: Fioricet, Alprazolam, Zoloft, Ambien...   Patient's Medications  New Prescriptions   CLOTRIMAZOLE-BETAMETHASONE (LOTRISONE) CREAM    Apply topically 2 (two) times daily. Use as directed  Previous Medications   ALPRAZOLAM (XANAX) 0.25 MG TABLET    take 1 tablet by mouth twice a day if needed   ASPIRIN 325 MG TABLET    Take 325 mg by mouth daily.   ATENOLOL (TENORMIN) 50 MG TABLET    TAKE 1 TABLET BY MOUTH ONCE DAILY   BUTALBITAL-ACETAMINOPHEN-CAFFEINE (FIORICET, ESGIC) 50-325-40 MG PER TABLET    take 1 tablet by mouth every  6 hours if needed   CALCIUM CARBONATE-VITAMIN D (CALTRATE 600+D) 600-400 MG-UNIT PER TABLET    Take 1 tablet by mouth daily.     DEXILANT 60 MG CAPSULE    take 1 capsule by mouth once daily -30 MIN BEFORE THE 1ST MEAL OF THE DAY   DIPHENHYD-HYDROCORT-NYSTATIN (FIRST-DUKES MOUTHWASH) SUSP    1 tsp gargle and swallow four times daily as needed   GEMFIBROZIL (LOPID) 600 MG TABLET    TAKE 1 TABLET BY MOUTH TWICE A DAY   MULTIPLE VITAMIN (MULTIVITAMIN) CAPSULE    Take 1 capsule by mouth daily.     NITROGLYCERIN (NITROSTAT) 0.4 MG SL TABLET    Place 1 tablet (0.4 mg total) under the tongue every 5 (five) minutes as needed for chest pain.   NYSTATIN-TRIAMCINOLONE OINTMENT (MYCOLOG)    APPLY TO AFFECTED AREA 2 TO 3 TIMES A DAY FOR UP TO 2 WEEKS, THEN USE 1 TO 3 TIMES A WEEK.   SALSALATE (DISALCID) 750 MG TABLET    Take 750 mg by mouth 2 (two) times daily. As directed by Dr. Priscille Kluver    SERTRALINE (ZOLOFT) 100 MG TABLET    take 1 tablet by mouth once daily   SIMVASTATIN (ZOCOR) 40 MG TABLET    take 1 tablet by mouth at bedtime   ZOLPIDEM (AMBIEN) 10 MG TABLET    Take 1 tablet (10 mg total) by mouth at bedtime as needed for sleep.   ZOLPIDEM (AMBIEN) 10 MG TABLET    take  1 tablet by mouth at bedtime if needed for sleep  Modified Medications   Modified Medication Previous Medication   CLIDINIUM-CHLORDIAZEPOXIDE (LIBRAX) 2.5-5 MG PER CAPSULE clidinium-chlordiazePOXIDE (LIBRAX) 2.5-5 MG per capsule      take 1 capsule by mouth three times a day if needed    take 1 capsule by mouth three times a day if needed   LEVOTHYROXINE (SYNTHROID, LEVOTHROID) 75 MCG TABLET levothyroxine (SYNTHROID, LEVOTHROID) 75 MCG tablet      take 1 tablet by mouth once daily    TAKE 1 TABLET BY MOUTH ONCE DAILY  Discontinued Medications   FLUCONAZOLE (DIFLUCAN) 100 MG TABLET    Take 2 today then 1 daily until gone

## 2012-08-21 ENCOUNTER — Ambulatory Visit: Payer: Medicare Other | Admitting: Pulmonary Disease

## 2012-08-27 ENCOUNTER — Other Ambulatory Visit: Payer: Self-pay | Admitting: Pulmonary Disease

## 2012-08-30 ENCOUNTER — Other Ambulatory Visit: Payer: Self-pay | Admitting: Pulmonary Disease

## 2012-08-31 ENCOUNTER — Telehealth: Payer: Self-pay | Admitting: Pulmonary Disease

## 2012-08-31 MED ORDER — AZITHROMYCIN 250 MG PO TABS: ORAL_TABLET | ORAL | Status: DC

## 2012-08-31 NOTE — Telephone Encounter (Signed)
Daughter is aware of PW recs. rx has been sent. Nothing further was needed

## 2012-08-31 NOTE — Telephone Encounter (Signed)
Azithromycin 250mg  Take two once then one daily until gone #6

## 2012-08-31 NOTE — Telephone Encounter (Addendum)
Called and spoke with pts daughter and she stated that the pt started with a runny nose and this has cleared up.  Pt has been coughing and getting up brown sputum, but yesterday started with green sputum.  pts daughter is requesting an abx to be called in for the pt.  SN is out of the office today, PW please advise. Thanks  Allergies  Allergen Reactions  . Penicillins     REACTION: yeast infection

## 2012-09-06 ENCOUNTER — Other Ambulatory Visit: Payer: Self-pay | Admitting: Pulmonary Disease

## 2012-09-06 NOTE — Telephone Encounter (Signed)
Pharmacy  Requesting  Butlbtl/apap/caff 50/325/40 <>take 1 tablet by mouth every 6 hours prn  Last fill 07-23-2012 Allergies  Allergen Reactions  . Penicillins     REACTION: yeast infection   Dr Kriste Basque is out of the office Dr Maple Hudson Please advise  Thank you

## 2012-09-12 ENCOUNTER — Other Ambulatory Visit: Payer: Self-pay | Admitting: Pulmonary Disease

## 2012-09-14 ENCOUNTER — Other Ambulatory Visit: Payer: Self-pay | Admitting: Pulmonary Disease

## 2012-09-17 ENCOUNTER — Other Ambulatory Visit: Payer: Self-pay | Admitting: Pulmonary Disease

## 2012-09-26 ENCOUNTER — Other Ambulatory Visit: Payer: Self-pay | Admitting: Pulmonary Disease

## 2012-10-14 ENCOUNTER — Other Ambulatory Visit: Payer: Self-pay | Admitting: Pulmonary Disease

## 2012-10-17 ENCOUNTER — Other Ambulatory Visit: Payer: Self-pay | Admitting: Pulmonary Disease

## 2012-10-18 ENCOUNTER — Telehealth: Payer: Self-pay | Admitting: Pulmonary Disease

## 2012-10-18 NOTE — Telephone Encounter (Signed)
LMOMTCB X1 for pt.  rx sent 10/14/12 #90 x 1 refill TID prn. Did she call the pharmacy for refill?

## 2012-10-18 NOTE — Telephone Encounter (Signed)
I spoke with [facility]. Was advised this medication has been awaiting p/u from pt- they were not awaiting nothing from [place]. They did receive RX when it was sent 10/14/12  [facility] x1 for pt

## 2012-10-18 NOTE — Telephone Encounter (Signed)
Pt aware rx at pharmacy.Mary Alexander

## 2012-10-18 NOTE — Telephone Encounter (Signed)
Pt returned call. I asked her if she had called the pharmacy. She says she has and was told to call our office. Hazel Sams

## 2012-10-25 ENCOUNTER — Other Ambulatory Visit: Payer: Self-pay | Admitting: Pulmonary Disease

## 2012-11-02 ENCOUNTER — Telehealth: Payer: Self-pay | Admitting: Pulmonary Disease

## 2012-11-02 MED ORDER — SALSALATE 750 MG PO TABS: 750.0000 mg | ORAL_TABLET | Freq: Two times a day (BID) | ORAL | Status: DC

## 2012-11-02 NOTE — Telephone Encounter (Signed)
Spoke with pt He states that her orthopedist retired and she needs refill on salsalate 750 mg bid Please advise if okay to refill, thanks! Allergies  Allergen Reactions  . Penicillins     REACTION: yeast infection   Last ov 07/23/12 Next ov 11/14/12

## 2012-11-02 NOTE — Telephone Encounter (Signed)
I spoke with pt and is aware RX has been sent to the pharmacy. She voiced her understanding and needed nothing further.

## 2012-11-02 NOTE — Telephone Encounter (Signed)
Per SN----ok to refill the salsalate 750 mg   1 po bid  As needed for back pain   #60 with 5 refills

## 2012-11-12 ENCOUNTER — Ambulatory Visit: Payer: Medicare Other | Admitting: Pulmonary Disease

## 2012-11-14 ENCOUNTER — Ambulatory Visit: Payer: Medicare Other | Admitting: Pulmonary Disease

## 2012-12-14 ENCOUNTER — Other Ambulatory Visit (INDEPENDENT_AMBULATORY_CARE_PROVIDER_SITE_OTHER): Payer: Medicare Other

## 2012-12-14 ENCOUNTER — Encounter: Payer: Self-pay | Admitting: Pulmonary Disease

## 2012-12-14 ENCOUNTER — Ambulatory Visit (INDEPENDENT_AMBULATORY_CARE_PROVIDER_SITE_OTHER): Payer: Medicare Other | Admitting: Pulmonary Disease

## 2012-12-14 VITALS — BP 126/68 | HR 60 | Temp 97.8°F | Ht 62.0 in | Wt 132.4 lb

## 2012-12-14 DIAGNOSIS — M1990 Unspecified osteoarthritis, unspecified site: Secondary | ICD-10-CM

## 2012-12-14 DIAGNOSIS — E559 Vitamin D deficiency, unspecified: Secondary | ICD-10-CM

## 2012-12-14 DIAGNOSIS — J309 Allergic rhinitis, unspecified: Secondary | ICD-10-CM

## 2012-12-14 DIAGNOSIS — F411 Generalized anxiety disorder: Secondary | ICD-10-CM

## 2012-12-14 DIAGNOSIS — D126 Benign neoplasm of colon, unspecified: Secondary | ICD-10-CM

## 2012-12-14 DIAGNOSIS — K589 Irritable bowel syndrome without diarrhea: Secondary | ICD-10-CM

## 2012-12-14 DIAGNOSIS — E039 Hypothyroidism, unspecified: Secondary | ICD-10-CM

## 2012-12-14 DIAGNOSIS — K219 Gastro-esophageal reflux disease without esophagitis: Secondary | ICD-10-CM

## 2012-12-14 DIAGNOSIS — R7309 Other abnormal glucose: Secondary | ICD-10-CM

## 2012-12-14 DIAGNOSIS — I10 Essential (primary) hypertension: Secondary | ICD-10-CM

## 2012-12-14 DIAGNOSIS — I639 Cerebral infarction, unspecified: Secondary | ICD-10-CM

## 2012-12-14 DIAGNOSIS — E7800 Pure hypercholesterolemia, unspecified: Secondary | ICD-10-CM

## 2012-12-14 DIAGNOSIS — I6350 Cerebral infarction due to unspecified occlusion or stenosis of unspecified cerebral artery: Secondary | ICD-10-CM

## 2012-12-14 DIAGNOSIS — R0789 Other chest pain: Secondary | ICD-10-CM

## 2012-12-14 DIAGNOSIS — R51 Headache: Secondary | ICD-10-CM

## 2012-12-14 LAB — CBC WITH DIFFERENTIAL/PLATELET
Basophils Absolute: 0.1 10*3/uL (ref 0.0–0.1)
Eosinophils Absolute: 0.1 10*3/uL (ref 0.0–0.7)
Lymphocytes Relative: 26.6 % (ref 12.0–46.0)
MCHC: 33.1 g/dL (ref 30.0–36.0)
Neutrophils Relative %: 58.6 % (ref 43.0–77.0)
Platelets: 279 10*3/uL (ref 150.0–400.0)
RBC: 4.38 Mil/uL (ref 3.87–5.11)
RDW: 14.3 % (ref 11.5–14.6)

## 2012-12-14 LAB — HEMOGLOBIN A1C: Hgb A1c MFr Bld: 6.2 % (ref 4.6–6.5)

## 2012-12-14 LAB — LIPID PANEL
Total CHOL/HDL Ratio: 5
Triglycerides: 171 mg/dL — ABNORMAL HIGH (ref 0.0–149.0)

## 2012-12-14 LAB — HEPATIC FUNCTION PANEL
ALT: 12 U/L (ref 0–35)
AST: 19 U/L (ref 0–37)
Alkaline Phosphatase: 63 U/L (ref 39–117)
Bilirubin, Direct: 0.1 mg/dL (ref 0.0–0.3)
Total Protein: 7.6 g/dL (ref 6.0–8.3)

## 2012-12-14 LAB — BASIC METABOLIC PANEL
CO2: 26 mEq/L (ref 19–32)
Chloride: 105 mEq/L (ref 96–112)
Creatinine, Ser: 1.2 mg/dL (ref 0.4–1.2)

## 2012-12-14 NOTE — Patient Instructions (Addendum)
Today we updated your med list in our EPIC system...    Continue your current medications the same...  Today we did your follow up FASTING blood work...    We will contact you w/ the results when available...   Try to increase your daily exercise program...  Call for any questions...  Let's plan a follow up visit in 98mo, sooner if needed for problems.Marland KitchenMarland Kitchen

## 2012-12-14 NOTE — Progress Notes (Signed)
Subjective:    Patient ID: Mary Alexander, female    DOB: 1927/01/12, 79 y.o.   MRN: [id number]  HPI 79 y/o WF here for a follow up visit... she has mult somatic complaints & mult medical problems as below:  ~  August 25, 2011:  32mo ROV & she went to the ER 9/12 w/ CC weakness, ?back pain- ?felt like she couldn't walk although exam was neg & non-focal; CTBrain w/ atrophy, sm vessel dis, no acute changes; CXR- clear; EKG- SBrady 58/min, LAD, 1st deg AVB, NSSTTWA; LABS- normal, BS=134, Hg=11.6, UA- clear...  Daughter is a Engineer, civil (consulting) & took her to [facility] (we don't have note) who said it wasn't her back;  Then called requesting Neuro eval to r/o stroke> she saw DrWong 10/12 & his exam revealed some ataxia & decr sensation distally; MRI showed atrophy & sm vessel dis, no intracranial lesions (but by DrWong's review he felt she had a splenium infarction); MRA showed intracranial atherosclerotic changes in branch vessels esp in middle cerebral & post cerebral distrib; MRA neck w/ 58% stenosis prox left ICA, & ectatic vertebrals w/ some narrowing;  He rec ASA 325mg /d + HomePT... He also requested 2DEcho done 10/12 w/ norm LVF/ wall motion/ & EF=55-60%, mod LAdil (45mm), neg bubble study...  They report that the PT has helped, she is ambulating w/ a rolling walker, she is taking the 325mg ASA...    HBP> on Aten50; BP= 126/60 w/o CP, palpit, SOB (but too sedentary), edema, etc...    Hyperlipid> on Simva40 & Lopid600Bid; FLP showed TChol 188, TG 251, HDL 42, LDL 95==> needs better low fat diet...    DM> on diet alone & labs showed FBS=112, A1c=6.2    Hypothyroid> on Synthroid75; TSH= 5.81 & reminded to take it everyday, if trend is up we will tweek dose later...    GERD/ IBS/ Polyps> on Dexilant60 & Librax prn; c/o gas & rec to add Simethacone preps prn- eg. Mylicon, Phazyme, etc...    GYN> she is c/o yeast infection & she is referred to her GYN for exam; they request Diflucan Rx (daugh is a Engineer, civil (consulting))- ok...    DJD/ LBP> on Disalcid750Bid per DrRendall; she is c/o neck pain and knee pain & DrRendall has done XRays & scans they tell me "it's normal wear & tear", using icey hot patches & spray as needed    HAs> on Fioricet prn & she insists on the "Teva blues" from her pharm...    Anxiety & Depression> on Zoloft100, Xanax0.25prn, Ambien10prn.  ~  February 23, 2012:  32mo ROV & Mary Alexander is here by herself today & notes that she is under lots of stress w/ husb's severe alz (can't toilet himself etc); she has recovered from her neuro event (?stroke) 9/12 on ASA 325mg /d w/ w/u as above from DrWong; PT has helped & now completed; she saw DrWong in f/u 4/13 w/ persistent gait abn, neck pain & imbalance> now w/ left sided HAs; he tried to replace her Ambien w/ Pamelor but she didn't like it & stopped on her own; she is on a lot of psychotropic meds for an 79y/o- Xanax, Zoloft, Ambien- but she states "I tolerate them just fine" & wants to continue her current meds the same...    We reviewed prob list, meds, xrays and labs> see below>> LABS 6/13:  FLP- at goals on Simva+Gemgib;  BMet- ok w/ BS=105 A1c=6.2  ~  July 23, 2012:  51mo ROV & add-on for mult somatic  complaints> Ha, nausea, ?slurred speech, tired/ sleepy, "mouth hurting" & MMW w/o help, gums sore & treated by Dentist w/ diflucan, having to wait on new teeth; she fell out of chair & memory poor; most of these problems occurred after husb passed away w/ incr stress for pt... We reviewed the following issues during today's OV>>    HBP> on ASA325, Aten50; BP=118/80 & she denies angina, palpit, ch in SOB (she is too sedentary), edema...    Chol> on Simva40, Lopid600Bid; last FLP 6/13 showed TChol 167, TG 142, HDL 46, LDL 92     Hypothy> on Synthroid75; last TSH=5.81 7 reminded to take med every day...    Cerebrovasc Dis, Stroke, HAs> on ASA325; she had neuro eval DrWang 6/13- gait abn, splenium infarct, mod cerebrovasc dis, tinnutus, HAs on Fioricet; I offered her PT but  she declined "I'm walking OK" she says...    Anxiety> on Zoloft100, Alprazolam0.25, Ambien10...  We reviewed prob list, meds, xrays and labs> see below for updates >>   ~  December 14, 2012:  60mo ROV & Mary Alexander notes balance is off- she was prev eval by DrWong & they have since taken her to [facility] in [place] (grand-daugh works in their office) & they report that he rec neuropsyche eval & PT (she is getting the latter but refused the former) & improving w/ PT...  She persists w/ mult somatic complaints but overall stable per daughter... We reviewed the following medical problems during today's office visit >>      HBP> on Aten50; BP= 126/68 & similar at home- denies CP, palpit, SOB (but too sedentary), edema, etc...    Hyperlipid> on Simva40 & Lopid600Bid; FLP shows TChol 170, TG 171, HDL 33, LDL 103==> needs better low fat diet & incr exercise...    DM> on diet alone & labs reveal FBS=116, A1c=6.2 (Stable)...    Hypothyroid> on Synthroid75; TSH= 3.42 & reminded to take it everyday...    GERD/ IBS/ Polyps> on Dexilant60 & Librax prn; c/o gas & rec to add Simethacone preps prn- eg. Mylicon, Phazyme, etc...    DJD/ LBP> on Disalcid750Bid per DrRendall; she is c/o neck pain and knee pain & DrRendall has done XRays & scans they tell me "it's normal wear & tear", using icey hot patches & spray as needed...    Stroke> on ZOX096; CTBrain w/ atrophy & sm vessel dis; prev eval DrWong, then [facility] [facility] in [place]> ?splenium infarct, MRA w/ intracranial atherosclerotic changes, MRA neck w/ prox left ICA dis, treated w/ PT which seems to help...    HAs> on Fioricet prn & she insists on the "Teva blues" from her pharm...    Anxiety & Depression> on Zoloft100, Xanax0.25prn, Ambien10prn...  We reviewed prob list, meds, xrays and labs> see below for updates >>          Problem List:  ALLERGIC RHINITIS (ICD-477.9) - on ALLEGRA 180mg /d just Prn now... Hx of SINUSITIS (ICD-473.9) ASTHMATIC BRONCHITIS, ACUTE  (ICD-466.0) - no recent exas etc...  ~  CXR 9/12 showed normal heart size, clear lungs, NAD...  HYPERTENSION (ICD-401.9) - controlled on ATENOLOL 50mg /d, and ASA 325mg /d now... ~  baseline EKG shows NSR, NSSTTWA... ~  NuclearStressTest 8/03 showed ? mild ischemia apical/ lat wall, EF=69%... referred to [facility]- ~  cath 8/03 showed normal coronaries... ~  2DEcho 10/12 showed norm LVF/ wall motion/ & EF=55-60%, mod LAdil (45mm), neg bubble study. ~  12/12:  BP=126/60 here today & similar at home... denies visual changes,  CP, dizziness, syncope, dyspnea, edema, etc... ~  6/13:  BP= 126/60 again & she denies CP, palpit, ch in SOB, edema, etc... ~  4/14:  on Aten50; BP= 126/68 & similar at home- denies CP, palpit, SOB (but too sedentary), edema, etc...  CHEST PAIN, ATYPICAL (ICD-786.59) & PALPITATIONS (ICD-785.1) - eval 10/09 by Walker Kehr w/ repeat Myoview= neg, no infarct or ischemia, EF= 81%...  she denies recent symptoms.  HYPERCHOLESTEROLEMIA (ICD-272.0) - on ZOCOR 40mg /d & LOPID 600Bid...  ~  FLP 8/08 showed TChol 167, TG 127, HDL 38, LDL 103... ~  FLP 2/09 showed TChol 208, TG 228, HDL 39, LDL 118... continue same med, better diet+ exercise. ~  FLP 8/09 showed TChol 205, TG 187, HDL 40, LDL 124... rec to take meds regularly & repeat. ~  FLP 12/09 on Simva20+LopidBid  showed TChol 218, TG 166, HDL 37, LDL 134... rec> incr Simva40. ~  FLP 6/10 on Simva40+LopidBid showed TChol 187, TG, 129, HDL 48, LDL 113 ~  FLP 12/10 on same showed TChol 182, TG 177, HDL 43, LDL 104... contin Rx, better diet. ~  FLP 6/11 showed TChol 184, TG 158, HDL 41, LDL 111 ~  FLP 12/11 showed TChol 221, TG 156, HDL 39, LDL 144... needs same med, much better diet! ~  FLP 6/12 showed TChol 190, TG 118, HDL 44, LDL 123... Improved  ~  FLP 12/12 on Simva40+Gemfib600Bid showed TChol 188, TG 251, HDL 42, LDL 95... Rec> better low fat diet... ~  FLP 6/13 on Simva40+Gemfib600Bid showed TChol 167, TG 142, HDL 46, LDL 92 ~  FLP  4/14 on Simva40+Lopid600bid showed TChol 170, TG 171, HDL 33, LDL 103  DIABETES MELLITUS, BORDERLINE (ICD-790.29) - on diet alone... ~  labs prev w/ FBS betw 125 - 145... ~  labs 8/09 showed BS= 125, HgA1c= 6.6 on diet therapy... ~  labs 12/09 showed BS= 112, A1c= 6.3.Marland Kitchen. rec> contin diet rx. ~  labs 12/10 showed BS= 114, A1c= 6.3.Marland KitchenMarland Kitchen continue diet rx. ~  labs 12/11 showed BS= 102, A1c= 6.1 ~  Labs 6/12 showed BS= 119, A1c= 6.4.Marland KitchenMarland Kitchen Continue diet efforts. ~  Labs 12/12 on diet alone showed FBS=112, A1c=6.2 ~  Labs 6/13 on diet alone showed FBS=105, A1c=6.2 ~  Labs 4/14 on diet alone showed BS= 116, A1c= 6.2  HYPOTHYROIDISM (ICD-244.9) - on SYNTHROID 62mcg/d... She is reminded to bring med bottles to office! ~  TSH 8/08 was 7.04 on Synthroid 64mcg/d, therefore increased to 37mcg/d... ~  TSH 2/09 on 26mcg/d was 0.34... keep same... ~  labs 8/09 showed TSH= 4.02 ~  labs 12/09 showed TSH= 4.23 ~  labs 12/10 on Levothy75 showed TSH= 1.24 ~  labs 5/11 on Levothy75 showed TSH= 2.37 ~  Labs 6/12 on Levothy75 showed TSH= 5.46... Reminded to take it every day. ~  Labs 12/12 on Levothy75 showed TSH= 5.81... May need incr dose to 88... ~  Labs 4/14 showed TSH= 3.42... Keep same.  GERD (ICD-530.81) - Protonix changed to DEXILANT 60mg /d & improved per pt...  ~  last EGD was 11/05 showing gastritis... rec for PPI Bid indefinitely... ~  8/09: c/o incr nocturnal reflux symptoms- rec> change Prevacid to Protonix, add Reglan 10mg Qhs, elevate HOB, avoid caffeine, etc... ~  10/10: saw DrStark w/ incr GERD- changed to DEXILANT 60mg /d which helped- planned EGD (not done).  COLONIC POLYPS (ICD-211.3) & IRRITABLE BOWEL SYNDROME (ICD-564.1) - she requests LIBRAX but doesn't want the generic "yellow" caps, she wants the non-generic "blue" caps... ~  last  colonoscopy was 8/06 by DrStark showing normal except for hems... f/u planned 54yrs. ~  sched for colonoscopy 11/10 (due to blood in stool) but she refused due to  prep required...  DEGENERATIVE JOINT DISEASE (ICD-715.90) & BACK PAIN, LUMBAR (ICD-724.2) - she is off the  Etodolac & Flexeril... off prev Mobic & Tramadol... she takes SALSALATE 750mg  Prn from DrRendall + calcium and vits...  Dr Priscille Kluver referrred her to [facility] for shots in back- she reports improvement.  STROKE, Cerebrovasc Disease >> she went to the ER 9/12 w/ CC weakness, ?back pain- ?felt like she couldn't walk although exam was neg & non-focal; CTBrain w/ atrophy, sm vessel dis, no acute changes; CXR- clear; EKG- SBrady 58/min, LAD, 1st deg AVB, NSSTTWA; LABS- normal, BS=134, Hg=11.6, UA- clear...  Daughter is a Engineer, civil (consulting) & took her to [facility] (we don't have note) who said it wasn't her back;  Then called requesting Neuro eval to r/o stroke> she saw DrWong 10/12 & his exam revealed some ataxia & decr sensation distally; MRI showed atrophy & sm vessel dis, no intracranial lesions (but by DrWong's review he felt she had a splenium infarction); MRA showed intracranial atherosclerotic changes in branch vessels esp in middle cerebral & post cerebral distrib; MRA neck w/ 58% stenosis prox left ICA, & ectatic vertebrals w/ some narrowing;  He rec ASA 325mg /d + HomePT... He also requested 2DEcho done 10/12 w/ norm LVF/ wall motion/ & EF=55-60%, mod LAdil (45mm), neg bubble study...  They report that the PT has helped, she is ambulating w/ a rolling walker, she is taking the 325mg ASA... ~  She has a gait abn & some imbalance; DrWong tried to switch Ambien to [employer] but she didn't tol the latter she says & stopped it on her own...  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  ANXIETY (ICD-300.00) - she was taking both Librium10 & Xanax 0.25mg ... she prefers the ALPRAZOLAM 0.25mg  Tid Prn... now on ZOLOFT 100mg /d & AMBIEN 10mg  Qhs for sleep.  Health Maintenance - GYN = DrLomax...  Urology = DrRDavis...  she had Shingles vaccine 6/09 at [facility]... she had PNEUMOVAX 2/09 here... she had the seasonal flu shot 10/09, and the 2010 Flu vaccine...   Past Surgical History  Procedure Laterality Date  . Vesicovaginal fistula closure w/ tah    . Polypectomy    . Cataract extraction      Outpatient Encounter Prescriptions as of 12/14/2012  Medication Sig Dispense Refill  . ALPRAZolam (XANAX) 0.25 MG tablet take 1 tablet by mouth twice a day if needed  60 tablet  5  . Antipyrine-Benzocaine (AURALGAN) 54-14 MG/ML SOLN instill 2 drops into affected ear three times a day  10 mL  1  . aspirin 325 MG tablet Take 325 mg by mouth daily.      Marland Kitchen atenolol (TENORMIN) 50 MG tablet TAKE 1 TABLET BY MOUTH ONCE DAILY  30 tablet  10  . butalbital-acetaminophen-caffeine (FIORICET, ESGIC) 50-325-40 MG per tablet take 1 tablet by mouth every 6 hours if needed  14 tablet  5  . Calcium Carbonate-Vitamin D (CALTRATE 600+D) 600-400 MG-UNIT per tablet Take 1 tablet by mouth daily.        . clidinium-chlordiazePOXIDE (LIBRAX) 2.5-5 MG per capsule take 1 capsule by mouth three times a day if needed  90 capsule  1  . clotrimazole-betamethasone (LOTRISONE) cream Apply topically 2 (two) times daily. Use as directed  30 g  0  . DEXILANT 60 MG capsule take 1 capsule by mouth once daily -30 MIN BEFORE THE 1ST MEAL OF THE DAY  30 capsule  11  . Diphenhyd-Hydrocort-Nystatin (FIRST-DUKES MOUTHWASH) SUSP 1 tsp gargle and swallow four times daily as needed  120 mL  5  . gemfibrozil (LOPID) 600 MG tablet TAKE 1 TABLET BY MOUTH TWICE A DAY  60 tablet  5  . levothyroxine (SYNTHROID, LEVOTHROID) 75 MCG tablet take 1 tablet by mouth once daily  30 tablet  5  . Multiple Vitamin (MULTIVITAMIN) capsule Take 1 capsule by mouth daily.        . nitroGLYCERIN (NITROSTAT) 0.4 MG SL tablet Place 1 tablet (0.4 mg total) under the tongue every 5 (five) minutes as needed for chest pain.  100 tablet  3  . nystatin-triamcinolone ointment (MYCOLOG) APPLY TO AFFECTED AREA 2 TO 3 TIMES A DAY FOR  UP TO 2 WEEKS, THEN USE 1 TO 3 TIMES A WEEK.  30 g  PRN  . salsalate (DISALCID) 750 MG tablet Take 1 tablet (750 mg total) by mouth 2 (two) times daily.  60 tablet  5  . sertraline (ZOLOFT) 100 MG tablet take 1 tablet by mouth once daily  30 tablet  5  . simvastatin (ZOCOR) 40 MG tablet take 1 tablet by mouth at bedtime  30 tablet  5  . zolpidem (AMBIEN) 10 MG tablet take 1 tablet by mouth at bedtime if needed for sleep  30 tablet  5  . [DISCONTINUED] azithromycin (ZITHROMAX) 250 MG tablet Take as directed  6 tablet  0  . [DISCONTINUED] clidinium-chlordiazePOXIDE (LIBRAX) 2.5-5 MG per capsule take 1 capsule by mouth three times a day if needed  90 capsule  4  . [DISCONTINUED] zolpidem (AMBIEN) 10 MG tablet Take 1 tablet (10 mg total) by mouth at bedtime as needed for sleep.  30 tablet  5  . [DISCONTINUED] zolpidem (AMBIEN) 10 MG tablet take 1 tablet by mouth at bedtime if needed for sleep  30 tablet  5  . [DISCONTINUED] zolpidem (AMBIEN) 10 MG tablet take 1 tablet by mouth at bedtime if needed for sleep  30 tablet  5   No facility-administered encounter medications on file as of 12/14/2012.    Allergies  Allergen Reactions  . Penicillins     REACTION: yeast infection    Current Medications, Allergies, Past Medical History, Past Surgical History, Family History, and Social History were reviewed in [employer] record.    Review of Systems        See HPI - all other systems neg except as noted... The patient complains of dyspnea on exertion, headaches, severe indigestion/heartburn, muscle weakness, difficulty walking, and depression.  The patient denies anorexia, fever, weight loss, weight gain, vision loss, decreased hearing, hoarseness, chest pain, syncope, peripheral edema, prolonged cough, hemoptysis, abdominal pain, melena, hematochezia, hematuria, incontinence, suspicious skin lesions, transient blindness, unusual weight change, abnormal bleeding, enlarged lymph  nodes, and angioedema.    Objective:   Physical Exam     WD, WN, 79 y/o WF in NAD... GENERAL:  Alert & oriented; pleasant & cooperative... HEENT:  [place]/AT, EOM-wnl, PERRLA, EACs-clear, TMs-wnl, NOSE-clear, THROAT-clear & wnl. NECK:  Supple w/ fairROM; no JVD; normal carotid impulses w/o bruits; no thyromegaly or nodules palpated; no lymphadenopathy. CHEST:  Clear to P & A; without wheezes/ rales/ or rhonchi. HEART:  Regular Rhythm; without murmurs/ rubs/ or gallops. ABDOMEN:  Soft & nontender; normal bowel sounds; no organomegaly or masses  detected. EXT: without deformities, mildarthritic changes; no varicose veins/ venous insuffic/ or edema. NEURO:  CN's intact; no focal neuro deficits other than gait abn... DERM:  No lesions noted; no rash etc...  RADIOLOGY DATA:  Reviewed in the EPIC EMR & discussed w/ the patient...  LABORATORY DATA:  Reviewed in the EPIC EMR & discussed w/ the patient...   Assessment & Plan:    HBP>  Controlled on Atenolol, continue same...  CHOL>  On Simva40 + Gemfib Bid; FLP is reviewed & similar> she needs a better low fat diet!!!  DM>  Stable on diet alone & we reviewed low carb restriction etc...  HYPOTHY>  There is some confusion about her Levothy dose- 75, ?88, and she is reminded these questions are easily answered if she brings all her med bottles to every visit... Keep same for now & bring bottles on return...  GI>  GERD/ IBS/ Polyps>  Continue Dexilant, Librax, etc...  DJD/ LBP>  She continues on Disalcid from DrRendall...  Neuro> ?TIA, ?Stroke>  See above, eval per DrWong & he has rec PT/OT (it is helping) and ASA 325mg /d...  HA/ Anxiety/ Insomnia/ etc>  She requests refills for her chronic meds including: Fioricet, Alprazolam, Zoloft, Ambien...   Patient's Medications  New Prescriptions   No medications on file  Previous Medications   ALPRAZOLAM (XANAX) 0.25 MG TABLET    take 1 tablet by mouth twice a day if needed   ANTIPYRINE-BENZOCAINE  (AURALGAN) 54-14 MG/ML SOLN    instill 2 drops into affected ear three times a day   ASPIRIN 325 MG TABLET    Take 325 mg by mouth daily.   ATENOLOL (TENORMIN) 50 MG TABLET    TAKE 1 TABLET BY MOUTH ONCE DAILY   BUTALBITAL-ACETAMINOPHEN-CAFFEINE (FIORICET, ESGIC) 50-325-40 MG PER TABLET    take 1 tablet by mouth every 6 hours if needed   CALCIUM CARBONATE-VITAMIN D (CALTRATE 600+D) 600-400 MG-UNIT PER TABLET    Take 1 tablet by mouth daily.     CLIDINIUM-CHLORDIAZEPOXIDE (LIBRAX) 2.5-5 MG PER CAPSULE    take 1 capsule by mouth three times a day if needed   CLOTRIMAZOLE-BETAMETHASONE (LOTRISONE) CREAM    Apply topically 2 (two) times daily. Use as directed   DEXILANT 60 MG CAPSULE    take 1 capsule by mouth once daily -30 MIN BEFORE THE 1ST MEAL OF THE DAY   DIPHENHYD-HYDROCORT-NYSTATIN (FIRST-DUKES MOUTHWASH) SUSP    1 tsp gargle and swallow four times daily as needed   GEMFIBROZIL (LOPID) 600 MG TABLET    TAKE 1 TABLET BY MOUTH TWICE A DAY   LEVOTHYROXINE (SYNTHROID, LEVOTHROID) 75 MCG TABLET    take 1 tablet by mouth once daily   MULTIPLE VITAMIN (MULTIVITAMIN) CAPSULE    Take 1 capsule by mouth daily.     NITROGLYCERIN (NITROSTAT) 0.4 MG SL TABLET    Place 1 tablet (0.4 mg total) under the tongue every 5 (five) minutes as needed for chest pain.   NYSTATIN-TRIAMCINOLONE OINTMENT (MYCOLOG)    APPLY TO AFFECTED AREA 2 TO 3 TIMES A DAY FOR UP TO 2 WEEKS, THEN USE 1 TO 3 TIMES A WEEK.   SALSALATE (DISALCID) 750 MG TABLET    Take 1 tablet (750 mg total) by mouth 2 (two) times daily.   SERTRALINE (ZOLOFT) 100 MG TABLET    take 1 tablet by mouth once daily   SIMVASTATIN (ZOCOR) 40 MG TABLET    take 1 tablet by mouth at bedtime   ZOLPIDEM (AMBIEN)  10 MG TABLET    take 1 tablet by mouth at bedtime if needed for sleep  Modified Medications   No medications on file  Discontinued Medications   AZITHROMYCIN (ZITHROMAX) 250 MG TABLET    Take as directed   CLIDINIUM-CHLORDIAZEPOXIDE (LIBRAX) 2.5-5 MG PER  CAPSULE    take 1 capsule by mouth three times a day if needed   ZOLPIDEM (AMBIEN) 10 MG TABLET    Take 1 tablet (10 mg total) by mouth at bedtime as needed for sleep.   ZOLPIDEM (AMBIEN) 10 MG TABLET    take 1 tablet by mouth at bedtime if needed for sleep   ZOLPIDEM (AMBIEN) 10 MG TABLET    take 1 tablet by mouth at bedtime if needed for sleep

## 2012-12-15 LAB — VITAMIN D 25 HYDROXY (VIT D DEFICIENCY, FRACTURES): Vit D, 25-Hydroxy: 87 ng/mL (ref 30–89)

## 2013-01-22 ENCOUNTER — Other Ambulatory Visit: Payer: Self-pay | Admitting: Pulmonary Disease

## 2013-01-23 ENCOUNTER — Telehealth: Payer: Self-pay | Admitting: Pulmonary Disease

## 2013-01-23 NOTE — Telephone Encounter (Signed)
Spoke with Daughter I advised that last shingles vaccine was in 09 and she does not require another vaccine She states pt seen at [facility] and is being treated with acyclovir  She is to call here for appt if not improving on her current regimen Nothing further needed

## 2013-01-25 ENCOUNTER — Other Ambulatory Visit: Payer: Self-pay | Admitting: Pulmonary Disease

## 2013-01-30 ENCOUNTER — Other Ambulatory Visit: Payer: Self-pay | Admitting: Pulmonary Disease

## 2013-01-30 MED ORDER — FIRST-DUKES MOUTHWASH MT SUSP: OROMUCOSAL | Status: DC

## 2013-01-31 ENCOUNTER — Other Ambulatory Visit: Payer: Self-pay | Admitting: Pulmonary Disease

## 2013-02-07 ENCOUNTER — Other Ambulatory Visit: Payer: Self-pay | Admitting: Pulmonary Disease

## 2013-02-12 ENCOUNTER — Telehealth: Payer: Self-pay | Admitting: Pulmonary Disease

## 2013-02-12 NOTE — Telephone Encounter (Signed)
ATC line has fast busy signal x 3 wcb 

## 2013-02-13 MED ORDER — FIRST-DUKES MOUTHWASH MT SUSP: OROMUCOSAL | Status: DC

## 2013-02-13 NOTE — Telephone Encounter (Signed)
[facility] was called in 01/30/13 #120 x 5 refills.  I spoke with pt and she stated this was never received. I called [facility] and spoke with [facility] and they did not received. I gave VO for this. Nothing further was needed Pt aware this was called in.

## 2013-03-08 ENCOUNTER — Other Ambulatory Visit: Payer: Self-pay | Admitting: Pulmonary Disease

## 2013-03-08 ENCOUNTER — Telehealth: Payer: Self-pay | Admitting: Pulmonary Disease

## 2013-03-08 DIAGNOSIS — N200 Calculus of kidney: Secondary | ICD-10-CM

## 2013-03-08 DIAGNOSIS — K8020 Calculus of gallbladder without cholecystitis without obstruction: Secondary | ICD-10-CM

## 2013-03-08 NOTE — Telephone Encounter (Signed)
Pt's daughter returning call can be reached at [phone].Mary Alexander

## 2013-03-08 NOTE — Telephone Encounter (Signed)
[phone] returning call

## 2013-03-08 NOTE — Telephone Encounter (Signed)
LMTCB for Mary Alexander 

## 2013-03-08 NOTE — Telephone Encounter (Signed)
lmtcb

## 2013-03-08 NOTE — Telephone Encounter (Signed)
Per SN . Force fluids and set up appt w/ urology  I spoke with Tamela Oddi and is aware of SN recs. Referral sent

## 2013-03-08 NOTE — Telephone Encounter (Signed)
Spoke with pt's daughter Glennon Mac She states pt having rt lower abd pain since 6/24 She took the pt to ED in [place] 6/24 and ct significant for gallstones and sand sized kidney stones They prescribed percocet and advised to see nephrologist  The first available with nephrology not until 04/04/13 and percocet not helping relieve pain at all  She describes pain as moderate to severe and pain gets worse when she stands up from sitting  Daughter asking for any recs from SN Last ov 12/14/12  Next ov 06/19/13  Allergies  Allergen Reactions  . Penicillins     REACTION: yeast infection

## 2013-03-11 NOTE — Telephone Encounter (Signed)
lmtcb x1 

## 2013-03-12 NOTE — Telephone Encounter (Signed)
ATC pt x1, was directed to voice mail but VM is full > unable to LM.  WCB.

## 2013-03-13 ENCOUNTER — Other Ambulatory Visit: Payer: Self-pay | Admitting: Pulmonary Disease

## 2013-03-13 NOTE — Telephone Encounter (Signed)
Received VM and was advised VM is full--WCB

## 2013-03-14 NOTE — Telephone Encounter (Signed)
ATC contact # Went to VM automated msg received that mailbox is full unable to leave msg [facility]

## 2013-03-18 NOTE — Telephone Encounter (Signed)
Called, spoke with pt's daughter, Mary Alexander. Per Mary Alexander, pt did have a kidney stone so she wanted to get rx for flomax to help with this. Reports pt has now passed her kidney stone and nothing further is needed. Will sign off and route to Sn as FYI.

## 2013-03-19 ENCOUNTER — Other Ambulatory Visit: Payer: Self-pay | Admitting: Pulmonary Disease

## 2013-04-07 ENCOUNTER — Other Ambulatory Visit: Payer: Self-pay | Admitting: Pulmonary Disease

## 2013-04-12 ENCOUNTER — Other Ambulatory Visit: Payer: Self-pay | Admitting: Pulmonary Disease

## 2013-04-19 ENCOUNTER — Telehealth: Payer: Self-pay | Admitting: Pulmonary Disease

## 2013-04-19 DIAGNOSIS — N390 Urinary tract infection, site not specified: Secondary | ICD-10-CM

## 2013-04-19 DIAGNOSIS — N200 Calculus of kidney: Secondary | ICD-10-CM

## 2013-04-19 MED ORDER — CIPROFLOXACIN HCL 250 MG PO TABS: 250.0000 mg | ORAL_TABLET | Freq: Two times a day (BID) | ORAL | Status: DC

## 2013-04-19 NOTE — Telephone Encounter (Signed)
I spoke with [facility] and aware of recs. Referral sent

## 2013-04-19 NOTE — Telephone Encounter (Signed)
Per SN---  cipro 250 mg  #14  1 po bid---she has kidney stones and recurrent UTI's.  She really needs to see urology.  We can refer her if they would like.  thanks

## 2013-04-19 NOTE — Telephone Encounter (Signed)
Spoke with patients caregiver, Tamela Oddi Per [facility] patient is having frequent urination and abd pain x 2-3 days Pt c/o burning while urinating but denies any odor Tamela Oddi states they have tried Cran Grape juice and ibuprofen Tamela Oddi also reports patient fell this morning so she is wanting to ensure this isnt a UTI and wont progress over the weekend Dr. Kriste Basque please advise, thank you  Last OV 12/14/12 Next OV 06/19/13  Allergies  Allergen Reactions  . Penicillins     REACTION: yeast infection

## 2013-05-03 ENCOUNTER — Ambulatory Visit (INDEPENDENT_AMBULATORY_CARE_PROVIDER_SITE_OTHER): Payer: Medicare Other | Admitting: Adult Health

## 2013-05-03 ENCOUNTER — Encounter: Payer: Self-pay | Admitting: Adult Health

## 2013-05-03 VITALS — BP 140/74 | HR 79 | Temp 97.3°F | Ht 62.0 in | Wt 130.0 lb

## 2013-05-03 DIAGNOSIS — N390 Urinary tract infection, site not specified: Secondary | ICD-10-CM

## 2013-05-03 NOTE — Progress Notes (Signed)
Subjective:    Patient ID: Mary Alexander, female    DOB: 05/23/1927, 79 y.o.   MRN: [id number]  HPI 79 y/o WF here for a follow up visit... she has mult somatic complaints & mult medical problems as below:  ~  August 25, 2011:  70mo ROV & she went to the ER 9/12 w/ CC weakness, ?back pain- ?felt like she couldn't walk although exam was neg & non-focal; CTBrain w/ atrophy, sm vessel dis, no acute changes; CXR- clear; EKG- SBrady 58/min, LAD, 1st deg AVB, NSSTTWA; LABS- normal, BS=134, Hg=11.6, UA- clear...  Daughter is a Engineer, civil (consulting) & took her to [facility] (we don't have note) who said it wasn't her back;  Then called requesting Neuro eval to r/o stroke> she saw DrWong 10/12 & his exam revealed some ataxia & decr sensation distally; MRI showed atrophy & sm vessel dis, no intracranial lesions (but by DrWong's review he felt she had a splenium infarction); MRA showed intracranial atherosclerotic changes in branch vessels esp in middle cerebral & post cerebral distrib; MRA neck w/ 58% stenosis prox left ICA, & ectatic vertebrals w/ some narrowing;  He rec ASA 325mg /d + HomePT... He also requested 2DEcho done 10/12 w/ norm LVF/ wall motion/ & EF=55-60%, mod LAdil (45mm), neg bubble study...  They report that the PT has helped, she is ambulating w/ a rolling walker, she is taking the 325mg ASA...    HBP> on Aten50; BP= 126/60 w/o CP, palpit, SOB (but too sedentary), edema, etc...    Hyperlipid> on Simva40 & Lopid600Bid; FLP showed TChol 188, TG 251, HDL 42, LDL 95==> needs better low fat diet...    DM> on diet alone & labs showed FBS=112, A1c=6.2    Hypothyroid> on Synthroid75; TSH= 5.81 & reminded to take it everyday, if trend is up we will tweek dose later...    GERD/ IBS/ Polyps> on Dexilant60 & Librax prn; c/o gas & rec to add Simethacone preps prn- eg. Mylicon, Phazyme, etc...    GYN> she is c/o yeast infection & she is referred to her GYN for exam; they request Diflucan Rx (daugh is a Engineer, civil (consulting))- ok...     DJD/ LBP> on Disalcid750Bid per DrRendall; she is c/o neck pain and knee pain & DrRendall has done XRays & scans they tell me "it's normal wear & tear", using icey hot patches & spray as needed    HAs> on Fioricet prn & she insists on the "Teva blues" from her pharm...    Anxiety & Depression> on Zoloft100, Xanax0.25prn, Ambien10prn.  ~  February 23, 2012:  70mo ROV & Mary Alexander is here by herself today & notes that she is under lots of stress w/ husb's severe alz (can't toilet himself etc); she has recovered from her neuro event (?stroke) 9/12 on ASA 325mg /d w/ w/u as above from DrWong; PT has helped & now completed; she saw DrWong in f/u 4/13 w/ persistent gait abn, neck pain & imbalance> now w/ left sided HAs; he tried to replace her Ambien w/ Pamelor but she didn't like it & stopped on her own; she is on a lot of psychotropic meds for an 79y/o- Xanax, Zoloft, Ambien- but she states "I tolerate them just fine" & wants to continue her current meds the same...    We reviewed prob list, meds, xrays and labs> see below>> LABS 6/13:  FLP- at goals on Simva+Gemgib;  BMet- ok w/ BS=105 A1c=6.2  ~  July 23, 2012:  82mo ROV & add-on for mult  somatic complaints> Ha, nausea, ?slurred speech, tired/ sleepy, "mouth hurting" & MMW w/o help, gums sore & treated by Dentist w/ diflucan, having to wait on new teeth; she fell out of chair & memory poor; most of these problems occurred after husb passed away w/ incr stress for pt... We reviewed the following issues during today's OV>>    HBP> on ASA325, Aten50; BP=118/80 & she denies angina, palpit, ch in SOB (she is too sedentary), edema...    Chol> on Simva40, Lopid600Bid; last FLP 6/13 showed TChol 167, TG 142, HDL 46, LDL 92     Hypothy> on Synthroid75; last TSH=5.81 7 reminded to take med every day...    Cerebrovasc Dis, Stroke, HAs> on ASA325; she had neuro eval DrWang 6/13- gait abn, splenium infarct, mod cerebrovasc dis, tinnutus, HAs on Fioricet; I offered her PT but  she declined "I'm walking OK" she says...    Anxiety> on Zoloft100, Alprazolam0.25, Ambien10...  We reviewed prob list, meds, xrays and labs> see below for updates >>   ~  December 14, 2012:  78mo ROV & Mary Alexander notes balance is off- she was prev eval by DrWong & they have since taken her to [facility] in [place] (grand-daugh works in their office) & they report that he rec neuropsyche eval & PT (she is getting the latter but refused the former) & improving w/ PT...  She persists w/ mult somatic complaints but overall stable per daughter... We reviewed the following medical problems during today's office visit >>      HBP> on Aten50; BP= 126/68 & similar at home- denies CP, palpit, SOB (but too sedentary), edema, etc...    Hyperlipid> on Simva40 & Lopid600Bid; FLP shows TChol 170, TG 171, HDL 33, LDL 103==> needs better low fat diet & incr exercise...    DM> on diet alone & labs reveal FBS=116, A1c=6.2 (Stable)...    Hypothyroid> on Synthroid75; TSH= 3.42 & reminded to take it everyday...    GERD/ IBS/ Polyps> on Dexilant60 & Librax prn; c/o gas & rec to add Simethacone preps prn- eg. Mylicon, Phazyme, etc...    DJD/ LBP> on Disalcid750Bid per DrRendall; she is c/o neck pain and knee pain & DrRendall has done XRays & scans they tell me "it's normal wear & tear", using icey hot patches & spray as needed...    Stroke> on HYQ657; CTBrain w/ atrophy & sm vessel dis; prev eval DrWong, then [facility] in [place]> ?splenium infarct, MRA w/ intracranial atherosclerotic changes, MRA neck w/ prox left ICA dis, treated w/ PT which seems to help...    HAs> on Fioricet prn & she insists on the "Teva blues" from her pharm...    Anxiety & Depression> on Zoloft100, Xanax0.25prn, Ambien10prn...  We reviewed prob list, meds, xrays and labs> see below for updates >>   05/03/2013 [facility] follow up  Admitted recently at [facility] for Urosepsis , tx abx, fluids . E.coli resistant to cipro and bactrim.  Discharged on  cefpodoxime, has few doses left.  Feeling better but still weak.  Records reviewed from [facility]. Her chest x-ray was unremarkable. She did have a bump in her serum creatinine that improved with IV fluids. Patient denies any hematuria, chest pain, shortness, of breath, abdominal pain, nausea, vomiting        Problem List:  ALLERGIC RHINITIS (ICD-477.9) - on ALLEGRA 180mg /d just Prn now... Hx of SINUSITIS (ICD-473.9) ASTHMATIC BRONCHITIS, ACUTE (ICD-466.0) - no recent exas etc...  ~  CXR 9/12 showed normal  heart size, clear lungs, NAD...  HYPERTENSION (ICD-401.9) - controlled on ATENOLOL 50mg /d, and ASA 325mg /d now... ~  baseline EKG shows NSR, NSSTTWA... ~  NuclearStressTest 8/03 showed ? mild ischemia apical/ lat wall, EF=69%... referred to [facility]- ~  cath 8/03 showed normal coronaries... ~  2DEcho 10/12 showed norm LVF/ wall motion/ & EF=55-60%, mod LAdil (45mm), neg bubble study. ~  12/12:  BP=126/60 here today & similar at home... denies visual changes, CP, dizziness, syncope, dyspnea, edema, etc... ~  6/13:  BP= 126/60 again & she denies CP, palpit, ch in SOB, edema, etc... ~  4/14:  on Aten50; BP= 126/68 & similar at home- denies CP, palpit, SOB (but too sedentary), edema, etc...  CHEST PAIN, ATYPICAL (ICD-786.59) & PALPITATIONS (ICD-785.1) - eval 10/09 by Walker Kehr w/ repeat Myoview= neg, no infarct or ischemia, EF= 81%...  she denies recent symptoms.  HYPERCHOLESTEROLEMIA (ICD-272.0) - on ZOCOR 40mg /d & LOPID 600Bid...  ~  FLP 8/08 showed TChol 167, TG 127, HDL 38, LDL 103... ~  FLP 2/09 showed TChol 208, TG 228, HDL 39, LDL 118... continue same med, better diet+ exercise. ~  FLP 8/09 showed TChol 205, TG 187, HDL 40, LDL 124... rec to take meds regularly & repeat. ~  FLP 12/09 on Simva20+LopidBid  showed TChol 218, TG 166, HDL 37, LDL 134... rec> incr Simva40. ~  FLP 6/10 on Simva40+LopidBid showed TChol 187, TG, 129, HDL 48, LDL 113 ~  FLP 12/10 on same showed TChol 182, TG  177, HDL 43, LDL 104... contin Rx, better diet. ~  FLP 6/11 showed TChol 184, TG 158, HDL 41, LDL 111 ~  FLP 12/11 showed TChol 221, TG 156, HDL 39, LDL 144... needs same med, much better diet! ~  FLP 6/12 showed TChol 190, TG 118, HDL 44, LDL 123... Improved  ~  FLP 12/12 on Simva40+Gemfib600Bid showed TChol 188, TG 251, HDL 42, LDL 95... Rec> better low fat diet... ~  FLP 6/13 on Simva40+Gemfib600Bid showed TChol 167, TG 142, HDL 46, LDL 92 ~  FLP 4/14 on Simva40+Lopid600bid showed TChol 170, TG 171, HDL 33, LDL 103  DIABETES MELLITUS, BORDERLINE (ICD-790.29) - on diet alone... ~  labs prev w/ FBS betw 125 - 145... ~  labs 8/09 showed BS= 125, HgA1c= 6.6 on diet therapy... ~  labs 12/09 showed BS= 112, A1c= 6.3.Marland Kitchen. rec> contin diet rx. ~  labs 12/10 showed BS= 114, A1c= 6.3.Marland KitchenMarland Kitchen continue diet rx. ~  labs 12/11 showed BS= 102, A1c= 6.1 ~  Labs 6/12 showed BS= 119, A1c= 6.4.Marland KitchenMarland Kitchen Continue diet efforts. ~  Labs 12/12 on diet alone showed FBS=112, A1c=6.2 ~  Labs 6/13 on diet alone showed FBS=105, A1c=6.2 ~  Labs 4/14 on diet alone showed BS= 116, A1c= 6.2  HYPOTHYROIDISM (ICD-244.9) - on SYNTHROID 10mcg/d... She is reminded to bring med bottles to office! ~  TSH 8/08 was 7.04 on Synthroid 75mcg/d, therefore increased to 75mcg/d... ~  TSH 2/09 on 23mcg/d was 0.34... keep same... ~  labs 8/09 showed TSH= 4.02 ~  labs 12/09 showed TSH= 4.23 ~  labs 12/10 on Levothy75 showed TSH= 1.24 ~  labs 5/11 on Levothy75 showed TSH= 2.37 ~  Labs 6/12 on Levothy75 showed TSH= 5.46... Reminded to take it every day. ~  Labs 12/12 on Levothy75 showed TSH= 5.81... May need incr dose to 88... ~  Labs 4/14 showed TSH= 3.42... Keep same.  GERD (ICD-530.81) - Protonix changed to DEXILANT 60mg /d & improved per pt...  ~  last EGD  was 11/05 showing gastritis... rec for PPI Bid indefinitely... ~  8/09: c/o incr nocturnal reflux symptoms- rec> change Prevacid to Protonix, add Reglan 10mg Qhs, elevate HOB, avoid caffeine,  etc... ~  10/10: saw DrStark w/ incr GERD- changed to DEXILANT 60mg /d which helped- planned EGD (not done).  COLONIC POLYPS (ICD-211.3) & IRRITABLE BOWEL SYNDROME (ICD-564.1) - she requests LIBRAX but doesn't want the generic "yellow" caps, she wants the non-generic "blue" caps... ~  last colonoscopy was 8/06 by DrStark showing normal except for hems... f/u planned 64yrs. ~  sched for colonoscopy 11/10 (due to blood in stool) but she refused due to prep required...  DEGENERATIVE JOINT DISEASE (ICD-715.90) & BACK PAIN, LUMBAR (ICD-724.2) - she is off the  Etodolac & Flexeril... off prev Mobic & Tramadol... she takes SALSALATE 750mg  Prn from DrRendall + calcium and vits...  Dr Priscille Kluver referrred her to [facility] for shots in back- she reports improvement.  STROKE, Cerebrovasc Disease >> she went to the ER 9/12 w/ CC weakness, ?back pain- ?felt like she couldn't walk although exam was neg & non-focal; CTBrain w/ atrophy, sm vessel dis, no acute changes; CXR- clear; EKG- SBrady 58/min, LAD, 1st deg AVB, NSSTTWA; LABS- normal, BS=134, Hg=11.6, UA- clear...  Daughter is a Engineer, civil (consulting) & took her to [facility] (we don't have note) who said it wasn't her back;  Then called requesting Neuro eval to r/o stroke> she saw DrWong 10/12 & his exam revealed some ataxia & decr sensation distally; MRI showed atrophy & sm vessel dis, no intracranial lesions (but by DrWong's review he felt she had a splenium infarction); MRA showed intracranial atherosclerotic changes in branch vessels esp in middle cerebral & post cerebral distrib; MRA neck w/ 58% stenosis prox left ICA, & ectatic vertebrals w/ some narrowing;  He rec ASA 325mg /d + HomePT... He also requested 2DEcho done 10/12 w/ norm LVF/ wall motion/ & EF=55-60%, mod LAdil (45mm), neg bubble study...  They report that the PT has helped, she is ambulating w/ a rolling walker, she is taking the 325mg ASA... ~  She has a gait abn & some imbalance; DrWong tried to switch Ambien to  [employer] but she didn't tol the latter she says & stopped it on her own...  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  ANXIETY (ICD-300.00) - she was taking both Librium10 & Xanax 0.25mg ... she prefers the ALPRAZOLAM 0.25mg  Tid Prn... now on ZOLOFT 100mg /d & AMBIEN 10mg  Qhs for sleep.  Health Maintenance - GYN = DrLomax...  Urology = DrRDavis...  she had Shingles vaccine 6/09 at [facility]... she had PNEUMOVAX 2/09 here... she had the seasonal flu shot 10/09, and the 2010 Flu vaccine...   Past Surgical History  Procedure Laterality Date  . Vesicovaginal fistula closure w/ tah    . Polypectomy    . Cataract extraction      Outpatient Encounter Prescriptions as of 05/03/2013  Medication Sig Dispense Refill  . ALPRAZolam (XANAX) 0.25 MG tablet take 1 tablet by mouth twice a day if needed  60 tablet  5  . Antipyrine-Benzocaine (AURALGAN) 54-14 MG/ML SOLN instill 2 drops into affected ear three times a day  10 mL  1  . aspirin 325 MG tablet Take 325 mg by mouth daily.      Marland Kitchen atenolol (TENORMIN) 50 MG tablet TAKE 1 TABLET BY MOUTH ONCE DAILY  30 tablet  10  . butalbital-acetaminophen-caffeine (FIORICET, ESGIC) 50-325-40 MG per tablet take 1  tablet by mouth every 6 hours if needed  14 tablet  5  . Calcium Carbonate-Vitamin D (CALTRATE 600+D) 600-400 MG-UNIT per tablet Take 1 tablet by mouth daily.        . cefpodoxime (VANTIN) 100 MG tablet Take 100 mg by mouth 2 (two) times daily. X 8 days      . clidinium-chlordiazePOXIDE (LIBRAX) 2.5-5 MG per capsule take 1 capsule by mouth three times a day if needed  90 capsule  1  . clotrimazole-betamethasone (LOTRISONE) cream Apply topically 2 (two) times daily. Use as directed  30 g  0  . DEXILANT 60 MG capsule take 1 capsule by mouth once daily -30 MIN BEFORE THE 1ST MEAL OF THE DAY  30 capsule  11  . Diphenhyd-Hydrocort-Nystatin (FIRST-DUKES MOUTHWASH) SUSP 1 tsp gargle and swallow four  times daily as needed  120 mL  5  . gemfibrozil (LOPID) 600 MG tablet take 1 tablet by mouth twice a day  60 tablet  5  . levothyroxine (SYNTHROID, LEVOTHROID) 75 MCG tablet take 1 tablet by mouth once daily  30 tablet  5  . Multiple Vitamin (MULTIVITAMIN) capsule Take 1 capsule by mouth daily.        . nitroGLYCERIN (NITROSTAT) 0.4 MG SL tablet Place 1 tablet (0.4 mg total) under the tongue every 5 (five) minutes as needed for chest pain.  100 tablet  3  . nystatin-triamcinolone ointment (MYCOLOG) APPLY TO AFFECTED AREA 2 TO 3 TIMES A DAY FOR UP TO 2 WEEKS, THEN USE 1 TO 3 TIMES A WEEK.  30 g  PRN  . salsalate (DISALCID) 750 MG tablet Take 1 tablet (750 mg total) by mouth 2 (two) times daily.  60 tablet  5  . sertraline (ZOLOFT) 100 MG tablet take 1 tablet by mouth once daily  30 tablet  5  . simvastatin (ZOCOR) 40 MG tablet take 1 tablet by mouth at bedtime  30 tablet  5  . zolpidem (AMBIEN) 10 MG tablet take 1 tablet by mouth at bedtime if needed for sleep  30 tablet  5  . [DISCONTINUED] butalbital-acetaminophen-caffeine (FIORICET, ESGIC) 50-325-40 MG per tablet take 1 tablet by mouth every 6 hours if needed  14 tablet  5  . [DISCONTINUED] ciprofloxacin (CIPRO) 250 MG tablet Take 1 tablet (250 mg total) by mouth 2 (two) times daily.  14 tablet  0  . [DISCONTINUED] gemfibrozil (LOPID) 600 MG tablet take 1 tablet by mouth twice a day  60 tablet  5   No facility-administered encounter medications on file as of 05/03/2013.    Allergies  Allergen Reactions  . Penicillins     REACTION: yeast infection    Current Medications, Allergies, Past Medical History, Past Surgical History, Family History, and Social History were reviewed in [employer] record.    Review of Systems        See HPI - all other systems neg except as noted... The patient complains of dyspnea on exertion, headaches, severe indigestion/heartburn, muscle weakness, difficulty walking, and depression.   The patient denies anorexia, fever, weight loss, weight gain, vision loss, decreased hearing, hoarseness, chest pain, syncope, peripheral edema, prolonged cough, hemoptysis, abdominal pain, melena, hematochezia, hematuria, incontinence, suspicious skin lesions, transient blindness, unusual weight change, abnormal bleeding, enlarged lymph nodes, and angioedema.    Objective:   Physical Exam     WD, WN, 79 y/o WF in NAD... GENERAL:  Alert & oriented; pleasant & cooperative... HEENT:  [place]/AT, EOM-wnl,   EACs-clear,  TMs-wnl, NOSE-clear, THROAT-clear & wnl. NECK:  Supple w/ fairROM; no JVD; normal carotid impulses w/o bruits; no thyromegaly or nodules palpated; no lymphadenopathy. CHEST:  Clear to P & A; without wheezes/ rales/ or rhonchi. HEART:  Regular Rhythm; without murmurs/ rubs/ or gallops. ABDOMEN:  Soft & nontender; normal bowel sounds; no organomegaly or masses detected. EXT: without deformities, mildarthritic changes; no varicose veins/ venous insuffic/ or edema. NEURO:    no focal neuro deficits other than gait abn... DERM:  No lesions noted; no rash etc...    Assessment & Plan:

## 2013-05-03 NOTE — Patient Instructions (Addendum)
Finish Antibiotics as directed Drink plenty of fluids  Empty bladder often and completely.  follow up Dr. Kriste Basque  In 6 weeks and As needed   Please contact office for sooner follow up if symptoms do not improve or worsen or seek emergency care

## 2013-05-03 NOTE — Assessment & Plan Note (Signed)
Recent hospitalization for urosepsis, with an Escherichia coli UTI. That was resistant to Cipro, and Bactrim. Patient is slowly improving and is currently on antibiotics. She is to finish his antibiotics have advised on healthy urinary hygiene measures to increase her fluid intake. She has follow up appointment  with Dr. Kriste Basque in 6 weeks

## 2013-05-07 ENCOUNTER — Other Ambulatory Visit: Payer: Self-pay | Admitting: Pulmonary Disease

## 2013-05-23 ENCOUNTER — Telehealth: Payer: Self-pay | Admitting: Pulmonary Disease

## 2013-05-23 NOTE — Telephone Encounter (Signed)
I spoke with [facility]. She stated she needs a letter stating when pt had her PNA vaccine. Pt grand daughter works at [employer] studies and pt is going to get flu and PNA shot there. In EPIC it shows pt had Pneumococcal conjugate 10/30/2007 and Pneumococcal Polysacharide 02/10/2009 Please advise SN thanks

## 2013-05-24 ENCOUNTER — Encounter: Payer: Self-pay | Admitting: *Deleted

## 2013-05-24 NOTE — Telephone Encounter (Signed)
Per SN---  Ok to do the letter.  Last PNA vaccine was 02-10-2009.  Letter has been completed and faxed per pts daughter request.

## 2013-05-30 ENCOUNTER — Other Ambulatory Visit: Payer: Self-pay

## 2013-05-30 DIAGNOSIS — Z1231 Encounter for screening mammogram for malignant neoplasm of breast: Secondary | ICD-10-CM

## 2013-06-19 ENCOUNTER — Ambulatory Visit: Payer: Medicare Other | Admitting: Pulmonary Disease

## 2013-06-20 ENCOUNTER — Ambulatory Visit: Payer: Medicare Other

## 2013-06-21 ENCOUNTER — Other Ambulatory Visit: Payer: Self-pay | Admitting: Pulmonary Disease

## 2013-06-21 ENCOUNTER — Encounter: Payer: Self-pay | Admitting: Pulmonary Disease

## 2013-06-21 ENCOUNTER — Ambulatory Visit (INDEPENDENT_AMBULATORY_CARE_PROVIDER_SITE_OTHER): Payer: Medicare Other

## 2013-06-21 DIAGNOSIS — Z23 Encounter for immunization: Secondary | ICD-10-CM

## 2013-07-11 ENCOUNTER — Ambulatory Visit
Admission: RE | Admit: 2013-07-11 | Discharge: 2013-07-11 | Disposition: A | Discharge status: Home / Self Care | Payer: Medicare Other | Source: Ambulatory Visit

## 2013-07-11 DIAGNOSIS — Z1231 Encounter for screening mammogram for malignant neoplasm of breast: Secondary | ICD-10-CM

## 2013-07-31 ENCOUNTER — Telehealth: Payer: Self-pay | Admitting: Pulmonary Disease

## 2013-07-31 DIAGNOSIS — N898 Other specified noninflammatory disorders of vagina: Secondary | ICD-10-CM

## 2013-07-31 NOTE — Telephone Encounter (Signed)
I spoke with pt. She reports she has redness/burning around vaginal area, painful urination x couple weeks. NO blood in urine and no odor that she can tell. Pt is requesting recs. Please advise SN thanks  Allergies  Allergen Reactions  . Penicillins     REACTION: yeast infection

## 2013-07-31 NOTE — Telephone Encounter (Signed)
Per SN-refer to GYN asap. Order placed. [id number].  Carron Curie, CMA

## 2013-08-01 MED ORDER — FLUCONAZOLE 100 MG PO TABS: ORAL_TABLET | ORAL | Status: DC

## 2013-08-01 NOTE — Telephone Encounter (Signed)
lmomtcb to make pt aware of order placed.

## 2013-08-01 NOTE — Telephone Encounter (Signed)
pts daughter called back in and stated that she will find her a GYN to take her to see.  She stated that she is just raw and irritated in the vaginal area and she said that it seems she has the yeast infection flaring again.  Pts daughter requested that diflucan be sent to the pharmacy and this has been done.  The pts daughter will follow up on GYN.  Nothing further is needed.

## 2013-08-23 ENCOUNTER — Other Ambulatory Visit: Payer: Self-pay | Admitting: Pulmonary Disease

## 2013-08-28 ENCOUNTER — Other Ambulatory Visit: Payer: Self-pay | Admitting: Pulmonary Disease

## 2013-09-03 ENCOUNTER — Other Ambulatory Visit: Payer: Self-pay | Admitting: Pulmonary Disease

## 2013-09-13 ENCOUNTER — Other Ambulatory Visit: Payer: Self-pay | Admitting: Pulmonary Disease

## 2013-09-23 ENCOUNTER — Other Ambulatory Visit: Payer: Self-pay | Admitting: Pulmonary Disease

## 2013-09-25 NOTE — Telephone Encounter (Signed)
Rx approved this time by SN-pt aware to have her daughter look in her ear per SN.

## 2013-10-08 ENCOUNTER — Other Ambulatory Visit: Payer: Self-pay | Admitting: Pulmonary Disease

## 2013-10-10 ENCOUNTER — Other Ambulatory Visit: Payer: Self-pay | Admitting: Pulmonary Disease

## 2013-10-10 MED ORDER — SERTRALINE HCL 100 MG PO TABS: ORAL_TABLET | ORAL | Status: DC

## 2013-10-17 ENCOUNTER — Other Ambulatory Visit: Payer: Self-pay | Admitting: Pulmonary Disease

## 2013-10-30 ENCOUNTER — Other Ambulatory Visit: Payer: Self-pay | Admitting: Pulmonary Disease

## 2013-10-30 MED ORDER — FIRST-DUKES MOUTHWASH MT SUSP: OROMUCOSAL | Status: DC

## 2013-10-31 ENCOUNTER — Other Ambulatory Visit: Payer: Self-pay | Admitting: Pulmonary Disease

## 2013-11-08 ENCOUNTER — Other Ambulatory Visit: Payer: Self-pay | Admitting: Pulmonary Disease

## 2013-11-15 ENCOUNTER — Other Ambulatory Visit: Payer: Self-pay | Admitting: Pulmonary Disease

## 2013-11-20 ENCOUNTER — Telehealth: Payer: Self-pay | Admitting: Pulmonary Disease

## 2013-11-20 NOTE — Telephone Encounter (Signed)
ATC daughter NA and mailbox is full and can't accept messages at this time. WCB

## 2013-11-21 NOTE — Telephone Encounter (Signed)
Leigh did you call this patient about appointments? [place] Bing, CMA

## 2013-11-21 NOTE — Telephone Encounter (Signed)
lmomtcb for betsy 

## 2013-11-22 ENCOUNTER — Other Ambulatory Visit: Payer: Self-pay | Admitting: Pulmonary Disease

## 2013-11-22 NOTE — Telephone Encounter (Signed)
Called and spoke with betsy and she will check with her daughter and see if we can change appts with her on 3-18 so that the pt can see SN.  Gwinda Passe will call me back

## 2013-11-22 NOTE — Telephone Encounter (Signed)
Pt daughter call for referral or recommendation for new primary care dr. [phone] is where she can be reached her name is besty williams

## 2013-11-26 NOTE — Telephone Encounter (Signed)
ATC [name].  Line went directly to voicemail, then automated message stating that pt's mailbox is full.  WCB.

## 2013-11-28 NOTE — Telephone Encounter (Signed)
ATC [name] at cell number again, line went straight to voicemail then stated that the voicemail is full. Called the home number to speak with Gwinda Passe who verified that her daughter kept the 3.18 appt with SN Nothing further needed; will sign off.

## 2013-11-29 ENCOUNTER — Other Ambulatory Visit: Payer: Self-pay | Admitting: Pulmonary Disease

## 2013-12-06 ENCOUNTER — Telehealth: Payer: Self-pay | Admitting: Pulmonary Disease

## 2013-12-06 MED ORDER — CILIDINIUM-CHLORDIAZEPOXIDE 2.5-5 MG PO CAPS: ORAL_CAPSULE | ORAL | Status: DC

## 2013-12-06 MED ORDER — ZOLPIDEM TARTRATE 10 MG PO TABS: ORAL_TABLET | ORAL | Status: DC

## 2013-12-06 NOTE — Telephone Encounter (Signed)
rx for these meds have been sent to the pharmacy.  Gwinda Passe is aware.

## 2013-12-13 ENCOUNTER — Other Ambulatory Visit: Payer: Self-pay | Admitting: Pulmonary Disease

## 2013-12-18 ENCOUNTER — Telehealth: Payer: Self-pay | Admitting: Pulmonary Disease

## 2013-12-18 NOTE — Telephone Encounter (Signed)
Called and spoke with the pts daughter betsy and she stated that the medication was called to the pharmacy but the machine that it was left on was messed up and the pharmacy is not able to fill this medication.  i have called and spoke with the pharmacy and called the librax in and nothing further is needed.

## 2013-12-24 ENCOUNTER — Ambulatory Visit: Payer: Medicare Other | Admitting: Pulmonary Disease

## 2013-12-24 ENCOUNTER — Other Ambulatory Visit: Payer: Self-pay | Admitting: Pulmonary Disease

## 2014-01-08 ENCOUNTER — Other Ambulatory Visit: Payer: Self-pay | Admitting: Pulmonary Disease

## 2014-01-28 ENCOUNTER — Other Ambulatory Visit: Payer: Self-pay | Admitting: Pulmonary Disease

## 2014-02-06 NOTE — Telephone Encounter (Signed)
LMTCB for the pt to see if she has new PCP yet

## 2014-02-14 ENCOUNTER — Ambulatory Visit: Payer: Medicare Other | Admitting: Physician Assistant

## 2014-02-19 ENCOUNTER — Encounter: Payer: Self-pay | Admitting: Physician Assistant

## 2014-02-19 ENCOUNTER — Ambulatory Visit (INDEPENDENT_AMBULATORY_CARE_PROVIDER_SITE_OTHER): Payer: Medicare Other | Admitting: Physician Assistant

## 2014-02-19 VITALS — BP 123/65 | HR 65 | Ht 62.0 in | Wt 121.0 lb

## 2014-02-19 DIAGNOSIS — I639 Cerebral infarction, unspecified: Secondary | ICD-10-CM

## 2014-02-19 DIAGNOSIS — K219 Gastro-esophageal reflux disease without esophagitis: Secondary | ICD-10-CM

## 2014-02-19 DIAGNOSIS — F32A Depression, unspecified: Secondary | ICD-10-CM

## 2014-02-19 DIAGNOSIS — N200 Calculus of kidney: Secondary | ICD-10-CM

## 2014-02-19 DIAGNOSIS — N390 Urinary tract infection, site not specified: Secondary | ICD-10-CM

## 2014-02-19 DIAGNOSIS — F329 Major depressive disorder, single episode, unspecified: Secondary | ICD-10-CM

## 2014-02-19 DIAGNOSIS — F3289 Other specified depressive episodes: Secondary | ICD-10-CM

## 2014-02-19 DIAGNOSIS — Z131 Encounter for screening for diabetes mellitus: Secondary | ICD-10-CM

## 2014-02-19 DIAGNOSIS — I6350 Cerebral infarction due to unspecified occlusion or stenosis of unspecified cerebral artery: Secondary | ICD-10-CM

## 2014-02-19 DIAGNOSIS — E039 Hypothyroidism, unspecified: Secondary | ICD-10-CM

## 2014-02-19 DIAGNOSIS — E785 Hyperlipidemia, unspecified: Secondary | ICD-10-CM

## 2014-02-21 DIAGNOSIS — E785 Hyperlipidemia, unspecified: Secondary | ICD-10-CM | POA: Insufficient documentation

## 2014-02-21 DIAGNOSIS — F32A Depression, unspecified: Secondary | ICD-10-CM | POA: Insufficient documentation

## 2014-02-21 DIAGNOSIS — E039 Hypothyroidism, unspecified: Secondary | ICD-10-CM | POA: Insufficient documentation

## 2014-02-21 DIAGNOSIS — N200 Calculus of kidney: Secondary | ICD-10-CM | POA: Insufficient documentation

## 2014-02-21 DIAGNOSIS — K219 Gastro-esophageal reflux disease without esophagitis: Secondary | ICD-10-CM | POA: Insufficient documentation

## 2014-02-21 DIAGNOSIS — F329 Major depressive disorder, single episode, unspecified: Secondary | ICD-10-CM | POA: Insufficient documentation

## 2014-02-21 NOTE — Progress Notes (Signed)
Subjective:    Patient ID: Mary Alexander, female    DOB: [birth year], 80 y.o.   MRN: [id number]  HPI Patient is an 80 year old female who presents to the clinic to establish care.  .. Active Ambulatory Problems    Diagnosis Date Noted  . COLONIC POLYPS 10/29/2007  . HYPOTHYROIDISM 10/29/2007  . HYPERCHOLESTEROLEMIA 10/29/2007  . ANXIETY 10/29/2007  . HYPERTENSION 10/29/2007  . HEMORRHOIDS-EXTERNAL 05/01/2009  . ASTHMATIC BRONCHITIS, ACUTE 10/29/2007  . SINUSITIS 10/29/2007  . ALLERGIC RHINITIS 10/29/2007  . GERD 10/29/2007  . IRRITABLE BOWEL SYNDROME 08/26/2009  . RECTAL BLEEDING 05/01/2009  . HEMATOCHEZIA 05/28/2009  . DEGENERATIVE JOINT DISEASE 10/29/2007  . BACK PAIN, LUMBAR 10/29/2007  . SKIN RASH 11/29/2009  . HEADACHE, MIXED 10/30/2007  . PALPITATIONS 04/29/2008  . CHEST PAIN, ATYPICAL 07/03/2008  . DIABETES MELLITUS, BORDERLINE 08/13/2008  . PERSONAL HX COLONIC POLYPS 05/28/2009  . Cerebrovascular disease 08/25/2011  . Stroke 07/23/2012  . UTI (urinary tract infection) 05/03/2013  . Kidney stones 02/21/2014  . Hyperlipidemia 02/21/2014  . Depression 02/21/2014  . Hypothyroidism 02/21/2014  . GERD (gastroesophageal reflux disease) 02/21/2014   Resolved Ambulatory Problems    Diagnosis Date Noted  . No Resolved Ambulatory Problems   No Additional Past Medical History   .Marland Kitchen Family History  Problem Relation Age of Onset  . Heart disease Mother     Father  . COPD Sister   . Stroke Sister   . Breast cancer Sister   . Prostate cancer Brother    .Marland Kitchen History   Social History  . Marital Status: Married    Spouse Name: Kerry Dory    Number of Children: 2  . Years of Education: N/A   Occupational History  .     Social History Main Topics  . Smoking status: Never Smoker   . Smokeless tobacco: Never Used  . Alcohol Use: No  . Drug Use: No  . Sexual Activity: No   Other Topics Concern  . Not on file   Social History Narrative  . No narrative on  file   Patient is currently being followed by Dr. Ky Barban that nobody neurological for kidney stones. She had a repeat CT scan today to check for resolution. This has been ongoing for the last 2 weeks. She actually went to the hospital when they originally found to kidney stones. She's been on 2 antibiotics. She is currently on Omnicef and Flomax today. She denies any worsening of symptoms but she still feels fatigued, occasional nausea and achy. She denies any painful urination.  Patient ongoing chronic diseases. She does not need any refills today. She just needed to establish care with for she did run out of medications. She has not had blood work done in a while.    Review of Systems  All other systems reviewed and are negative.      Objective:   Physical Exam  Constitutional: She appears well-developed and well-nourished.  HENT:  Head: Normocephalic and atraumatic.  Cardiovascular: Normal rate, regular rhythm and normal heart sounds.   Pulmonary/Chest: Effort normal and breath sounds normal. She has no wheezes.  Tender bilateral CVA area.   Psychiatric: She has a normal mood and affect. Her behavior is normal.          Assessment & Plan:  Kidney stones/UTI-will let Dr. Ky Barban manage. Discuss with patient any changes, fever spike, increase in pain, fatigue or nausea please call Dr. Melvern Banker office.  Hypothyroidism-we'll recheck TSH and free T4 today. We'll call  with any changes to thyroid medication.  Hypercholesterolemia-ordered fasting labs to get at her convenience.  Hypertension-blood pressure control today. I refills landed on medication. Will followup as needed.  Stroke-she's continues to see Dr.crowl, neurologist yearly. cartoids done and per pt normal. No ongoing problems or post symptoms. ASA daily. Discussed only  Management is management of the disease that cause stroke.   CMP was done to look at liver/kidney/fasting glucose.  No refills were given today.  Patient discussed she did not need any.

## 2014-02-22 ENCOUNTER — Other Ambulatory Visit: Payer: Self-pay | Admitting: Physician Assistant

## 2014-02-24 ENCOUNTER — Other Ambulatory Visit: Payer: Self-pay | Admitting: *Deleted

## 2014-02-24 MED ORDER — CILIDINIUM-CHLORDIAZEPOXIDE 2.5-5 MG PO CAPS: ORAL_CAPSULE | ORAL | Status: DC

## 2014-02-24 MED ORDER — BUTALBITAL-APAP-CAFFEINE 50-325-40 MG PO TABS: ORAL_TABLET | ORAL | Status: DC

## 2014-02-24 MED ORDER — FIRST-DUKES MOUTHWASH MT SUSP: OROMUCOSAL | Status: DC

## 2014-02-27 ENCOUNTER — Other Ambulatory Visit: Payer: Self-pay | Admitting: *Deleted

## 2014-02-27 MED ORDER — SERTRALINE HCL 100 MG PO TABS: ORAL_TABLET | ORAL | Status: DC

## 2014-02-27 MED ORDER — ATENOLOL 50 MG PO TABS: ORAL_TABLET | ORAL | Status: DC

## 2014-02-27 MED ORDER — DEXLANSOPRAZOLE 60 MG PO CPDR: DELAYED_RELEASE_CAPSULE | ORAL | Status: DC

## 2014-02-27 MED ORDER — SIMVASTATIN 40 MG PO TABS: ORAL_TABLET | ORAL | Status: DC

## 2014-02-27 MED ORDER — ALPRAZOLAM 0.25 MG PO TABS: ORAL_TABLET | ORAL | Status: DC

## 2014-03-01 LAB — COMPLETE METABOLIC PANEL WITH GFR
ALK PHOS: 55 U/L (ref 39–117)
ALT: 8 U/L (ref 0–35)
AST: 15 U/L (ref 0–37)
Albumin: 4 g/dL (ref 3.5–5.2)
BILIRUBIN TOTAL: 0.3 mg/dL (ref 0.2–1.2)
BUN: 14 mg/dL (ref 6–23)
CO2: 27 mEq/L (ref 19–32)
Calcium: 9.1 mg/dL (ref 8.4–10.5)
Chloride: 107 mEq/L (ref 96–112)
Creat: 1.23 mg/dL — ABNORMAL HIGH (ref 0.50–1.10)
GFR, EST NON AFRICAN AMERICAN: 40 mL/min — AB
GFR, Est African American: 46 mL/min — ABNORMAL LOW
Glucose, Bld: 101 mg/dL — ABNORMAL HIGH (ref 70–99)
Potassium: 4.1 mEq/L (ref 3.5–5.3)
SODIUM: 143 meq/L (ref 135–145)
Total Protein: 6.2 g/dL (ref 6.0–8.3)

## 2014-03-01 LAB — LIPID PANEL
Cholesterol: 156 mg/dL (ref 0–200)
HDL: 36 mg/dL — ABNORMAL LOW (ref >39)
LDL CALC: 92 mg/dL (ref 0–99)
Total CHOL/HDL Ratio: 4.3 Ratio
Triglycerides: 138 mg/dL (ref <150)
VLDL: 28 mg/dL (ref 0–40)

## 2014-03-01 LAB — TSH: TSH: 48.176 u[IU]/mL — ABNORMAL HIGH (ref 0.350–4.500)

## 2014-03-01 LAB — T4, FREE: FREE T4: 0.61 ng/dL — AB (ref 0.80–1.80)

## 2014-03-02 ENCOUNTER — Other Ambulatory Visit: Payer: Self-pay | Admitting: Physician Assistant

## 2014-03-02 MED ORDER — LEVOTHYROXINE SODIUM 112 MCG PO TABS
112.0000 ug | ORAL_TABLET | Freq: Every day | ORAL | Status: DC
Start: 2014-03-02 — End: 2014-04-30

## 2014-03-12 ENCOUNTER — Ambulatory Visit (INDEPENDENT_AMBULATORY_CARE_PROVIDER_SITE_OTHER): Payer: Medicare Other | Admitting: Physician Assistant

## 2014-03-12 VITALS — BP 149/64 | HR 60 | Temp 98.2°F | Ht 62.0 in | Wt 133.0 lb

## 2014-03-12 DIAGNOSIS — Z87440 Personal history of urinary (tract) infections: Secondary | ICD-10-CM

## 2014-03-12 DIAGNOSIS — Z87442 Personal history of urinary calculi: Secondary | ICD-10-CM

## 2014-03-12 DIAGNOSIS — R300 Dysuria: Secondary | ICD-10-CM

## 2014-03-12 LAB — POCT URINALYSIS DIPSTICK
Bilirubin, UA: NEGATIVE
Blood, UA: NEGATIVE
Glucose, UA: NEGATIVE
Ketones, UA: NEGATIVE
Nitrite, UA: NEGATIVE
PROTEIN UA: NEGATIVE
SPEC GRAV UA: 1.02
UROBILINOGEN UA: 0.2
pH, UA: 6.5

## 2014-03-12 NOTE — Progress Notes (Signed)
Pt came today to follow up on UTI/kidney stone./ Mary Alexander

## 2014-03-13 NOTE — Progress Notes (Signed)
   Subjective:    Patient ID: Mary Alexander, female    DOB: Aug 03, 1927, 80 y.o.   MRN: [id number]  HPI Pt is being managed by Dr. Ky Barban for kidney stones and UTI. She request follow up that infection is gone. No current urinary symptoms.  No fever, chills, CVA tenderness, abdominal pain, nausea or vomiting. No dysuria or increase in frequency.    Review of Systems     Objective:   Physical Exam        Assessment & Plan:  Hx of UTI/kidney stones .Marland Kitchen Results for orders placed in visit on 03/12/14  POCT URINALYSIS DIPSTICK      Result Value Ref Range   Color, UA Yellow     Clarity, UA Clear     Glucose, UA Neg     Bilirubin, UA Neg     Ketones, UA Neg     Spec Grav, UA 1.020     Blood, UA Neg     pH, UA 6.5     Protein, UA Neg     Urobilinogen, UA 0.2     Nitrite, UA Neg     Leukocytes, UA Trace     Trace leuks but no blood or nitrates. These could be remenants from kidney stones. Will culture and fax results to Dr. Ky Barban for any further management.

## 2014-03-14 ENCOUNTER — Other Ambulatory Visit: Payer: Self-pay | Admitting: Physician Assistant

## 2014-03-14 MED ORDER — CIPROFLOXACIN HCL 500 MG PO TABS: 500.0000 mg | ORAL_TABLET | Freq: Two times a day (BID) | ORAL | Status: DC

## 2014-03-15 LAB — URINE CULTURE: Colony Count: >=100000

## 2014-03-17 ENCOUNTER — Other Ambulatory Visit: Payer: Self-pay | Admitting: Physician Assistant

## 2014-03-17 MED ORDER — NITROFURANTOIN MACROCRYSTAL 100 MG PO CAPS: 100.0000 mg | ORAL_CAPSULE | Freq: Two times a day (BID) | ORAL | Status: DC

## 2014-03-19 ENCOUNTER — Telehealth: Payer: Self-pay | Admitting: *Deleted

## 2014-03-19 NOTE — Telephone Encounter (Signed)
Pt's daughter called to let you know that the pt fell on sat & went to [facility].  Pt is just really sore now but nothing was broken.

## 2014-03-19 NOTE — Telephone Encounter (Signed)
Very sorry. Are her urinary symptoms feeling better? She can come follow up at any time.

## 2014-03-24 ENCOUNTER — Other Ambulatory Visit: Payer: Self-pay | Admitting: *Deleted

## 2014-03-24 MED ORDER — FIRST-DUKES MOUTHWASH MT SUSP: OROMUCOSAL | Status: DC

## 2014-04-10 ENCOUNTER — Encounter: Payer: Self-pay | Admitting: Sports Medicine

## 2014-04-10 ENCOUNTER — Ambulatory Visit (INDEPENDENT_AMBULATORY_CARE_PROVIDER_SITE_OTHER): Payer: Medicare Other

## 2014-04-10 ENCOUNTER — Ambulatory Visit (INDEPENDENT_AMBULATORY_CARE_PROVIDER_SITE_OTHER): Payer: Medicare Other | Admitting: Sports Medicine

## 2014-04-10 VITALS — BP 126/59 | HR 64 | Temp 98.1°F | Ht 62.0 in | Wt 131.0 lb

## 2014-04-10 DIAGNOSIS — R350 Frequency of micturition: Secondary | ICD-10-CM

## 2014-04-10 DIAGNOSIS — R0989 Other specified symptoms and signs involving the circulatory and respiratory systems: Secondary | ICD-10-CM

## 2014-04-10 LAB — POCT URINALYSIS DIPSTICK
Bilirubin, UA: NEGATIVE
Blood, UA: NEGATIVE
Glucose, UA: NEGATIVE
Ketones, UA: NEGATIVE
Leukocytes, UA: NEGATIVE
Nitrite, UA: NEGATIVE
Protein, UA: NEGATIVE
Spec Grav, UA: 1.02
Urobilinogen, UA: 0.2
pH, UA: 5.5

## 2014-04-10 MED ORDER — AZITHROMYCIN 250 MG PO TABS: ORAL_TABLET | ORAL | Status: DC

## 2014-04-10 NOTE — Assessment & Plan Note (Signed)
With subjective fevers and chills, left-sided chest pain, and mild upper respiratory symptoms this may represent a pneumonia. Chest x-ray, azithromycin, CBC, complete metabolic panel, quantiferon gold, blood cultures. Urinalysis was negative. No other sources of infection found.

## 2014-04-10 NOTE — Progress Notes (Signed)
  Subjective:    CC: Possible UTI/ vomiting last night  HPI: Patient is an 80 year old woman with history of past smoking and recent abx use  who presents to the clinic with a one day history of sweating, chills, and disorientation. Her daughter reports that she was tossing and turning all night and seemed disoriented when she went to the restroom. Patient thinks that she might be experiencing some increase in urination, but denies urgency, pain, changes in color, or smell. Patient admits to slight cough for past several days, but denies shortness of breath, palpitations. She says that she gets occasional chest pain, and is not sure whether or not it is related. The pain is non-pleuritic in nature. Patient denies nausea, diarrhea, blood in stool, abdominal pain or cramping. Admits to vomiting once last night. Patient has not noticed any rashes or open wounds. Patient was hospitalized 6 weeks ago after a fall, and has taken antibiotics recently, but has no recent travel history.  Patient has history of GERD. Chest pain is tight in nature, located below the left breast, worse with reclining, worse after meals, better with antacids. Exercise does not produce pain, rest does not relieve pain.   Past medical history, Surgical history, Family history not pertinant except as noted below, Social history, Allergies, and medications have been entered into the medical record, reviewed, and no changes needed.   Objective:    General: Well Developed, well nourished, and in no acute distress.  Neuro: Alert and oriented x3, extra-ocular muscles intact, sensation grossly intact.  HEENT: Normocephalic, atraumatic, pupils equal round reactive to light, neck supple, no masses, no lymphadenopathy, thyroid nonpalpable.  Skin: Warm and dry, no rashes. Cardiac: Regular rate and rhythm, no murmurs rubs or gallops, no lower extremity edema.  Respiratory: Lower lobe crackles bilaterally. Not using accessory muscles, speaking  in full sentences. Abdominal: No tenderness to palpation, soft w/ normal bowel sounds. No CVA tenderness  Urinalysis: completed in clinic was negative  Chest x-ray is negative.  Impression and Recommendations:    Patient's history is strongly indicative of infection, however, the lack of urinary symptoms, in conjunction with the negative urinalysis, suggests that a UTI is not the culprit. Due to bibasilar crackles and recent cough, we've ordered a chest x-ray and abx to cover for pneumonia. Additionally, we've ordered a CBC and CMP.  Chest pain: could be a component of her pneumonia, or related to her GERD. The pattern does not suggest cardiac origin.

## 2014-04-11 LAB — COMPREHENSIVE METABOLIC PANEL WITH GFR
Albumin: 3.9 g/dL (ref 3.5–5.2)
BUN: 18 mg/dL (ref 6–23)
CO2: 24 meq/L (ref 19–32)
Chloride: 99 meq/L (ref 96–112)
Creat: 0.93 mg/dL (ref 0.50–1.10)
Glucose, Bld: 98 mg/dL (ref 70–99)
Potassium: 3.9 meq/L (ref 3.5–5.3)

## 2014-04-11 LAB — CBC WITH DIFFERENTIAL/PLATELET
Basophils Absolute: 0.1 10*3/uL (ref 0.0–0.1)
Basophils Relative: 1 % (ref 0–1)
Eosinophils Absolute: 0.6 10*3/uL (ref 0.0–0.7)
Eosinophils Relative: 6 % — ABNORMAL HIGH (ref 0–5)
HCT: 32.6 % — ABNORMAL LOW (ref 36.0–46.0)
Hemoglobin: 10.9 g/dL — ABNORMAL LOW (ref 12.0–15.0)
Lymphocytes Relative: 18 % (ref 12–46)
Lymphs Abs: 1.7 K/uL (ref 0.7–4.0)
MCH: 27.9 pg (ref 26.0–34.0)
MCHC: 33.4 g/dL (ref 30.0–36.0)
MCV: 83.6 fL (ref 78.0–100.0)
Monocytes Absolute: 0.5 K/uL (ref 0.1–1.0)
Monocytes Relative: 5 % (ref 3–12)
Neutro Abs: 6.6 K/uL (ref 1.7–7.7)
Neutrophils Relative %: 70 % (ref 43–77)
Platelets: 312 10*3/uL (ref 150–400)
RBC: 3.9 MIL/uL (ref 3.87–5.11)
RDW: 15.7 % — ABNORMAL HIGH (ref 11.5–15.5)
WBC: 9.4 K/uL (ref 4.0–10.5)

## 2014-04-11 LAB — COMPREHENSIVE METABOLIC PANEL
ALT: 9 U/L (ref 0–35)
AST: 13 U/L (ref 0–37)
Alkaline Phosphatase: 66 U/L (ref 39–117)
Calcium: 9.2 mg/dL (ref 8.4–10.5)
Sodium: 134 mEq/L — ABNORMAL LOW (ref 135–145)
Total Bilirubin: 0.5 mg/dL (ref 0.2–1.2)
Total Protein: 6.4 g/dL (ref 6.0–8.3)

## 2014-04-14 LAB — QUANTIFERON TB GOLD ASSAY (BLOOD)
Interferon Gamma Release Assay: NEGATIVE
Mitogen value: 9.61 IU/mL
Quantiferon Nil Value: 0.03 IU/mL
Quantiferon Tb Ag Minus Nil Value: 0 [IU]/mL
TB Ag value: 0.03 IU/mL

## 2014-04-17 LAB — CULTURE, BLOOD (SINGLE): Organism ID, Bacteria: NO GROWTH

## 2014-04-24 ENCOUNTER — Ambulatory Visit: Payer: Medicare Other | Admitting: Sports Medicine

## 2014-04-25 ENCOUNTER — Encounter: Payer: Self-pay | Admitting: Physician Assistant

## 2014-04-25 ENCOUNTER — Ambulatory Visit (INDEPENDENT_AMBULATORY_CARE_PROVIDER_SITE_OTHER): Payer: Medicare Other | Admitting: Physician Assistant

## 2014-04-25 VITALS — BP 120/66 | HR 61 | Ht 62.0 in | Wt 129.0 lb

## 2014-04-25 DIAGNOSIS — J209 Acute bronchitis, unspecified: Secondary | ICD-10-CM

## 2014-04-25 DIAGNOSIS — R82998 Other abnormal findings in urine: Secondary | ICD-10-CM

## 2014-04-25 DIAGNOSIS — K649 Unspecified hemorrhoids: Secondary | ICD-10-CM

## 2014-04-25 DIAGNOSIS — R8290 Unspecified abnormal findings in urine: Secondary | ICD-10-CM

## 2014-04-25 DIAGNOSIS — N200 Calculus of kidney: Secondary | ICD-10-CM

## 2014-04-25 DIAGNOSIS — K5900 Constipation, unspecified: Secondary | ICD-10-CM

## 2014-04-25 LAB — POCT URINALYSIS DIPSTICK
BILIRUBIN UA: NEGATIVE
Blood, UA: NEGATIVE
GLUCOSE UA: NEGATIVE
Ketones, UA: NEGATIVE
LEUKOCYTES UA: NEGATIVE
Nitrite, UA: NEGATIVE
Protein, UA: NEGATIVE
Spec Grav, UA: >=1.03
UROBILINOGEN UA: 0.2
pH, UA: 5.5

## 2014-04-25 MED ORDER — HYDROCORTISONE 2.5 % RE CREA: 1.0000 "application " | TOPICAL_CREAM | Freq: Two times a day (BID) | RECTAL | Status: DC

## 2014-04-25 NOTE — Patient Instructions (Addendum)
Constipation- miralax 1 capful daily. Colace stool softener.   Warm sitz baths and cream for hemorrhoids.   Hemorrhoids Hemorrhoids are swollen veins around the rectum or anus. There are two types of hemorrhoids:   Internal hemorrhoids. These occur in the veins just inside the rectum. They may poke through to the outside and become irritated and painful.  External hemorrhoids. These occur in the veins outside the anus and can be felt as a painful swelling or hard lump near the anus. CAUSES  Pregnancy.   Obesity.   Constipation or diarrhea.   Straining to have a bowel movement.   Sitting for long periods on the toilet.  Heavy lifting or other activity that caused you to strain.  Anal intercourse. SYMPTOMS   Pain.   Anal itching or irritation.   Rectal bleeding.   Fecal leakage.   Anal swelling.   One or more lumps around the anus.  DIAGNOSIS  Your caregiver may be able to diagnose hemorrhoids by visual examination. Other examinations or tests that may be performed include:   Examination of the rectal area with a gloved hand (digital rectal exam).   Examination of anal canal using a small tube (scope).   A blood test if you have lost a significant amount of blood.  A test to look inside the colon (sigmoidoscopy or colonoscopy). TREATMENT Most hemorrhoids can be treated at home. However, if symptoms do not seem to be getting better or if you have a lot of rectal bleeding, your caregiver may perform a procedure to help make the hemorrhoids get smaller or remove them completely. Possible treatments include:   Placing a rubber band at the base of the hemorrhoid to cut off the circulation (rubber band ligation).   Injecting a chemical to shrink the hemorrhoid (sclerotherapy).   Using a tool to burn the hemorrhoid (infrared light therapy).   Surgically removing the hemorrhoid (hemorrhoidectomy).   Stapling the hemorrhoid to block blood flow to the  tissue (hemorrhoid stapling).  HOME CARE INSTRUCTIONS   Eat foods with fiber, such as whole grains, beans, nuts, fruits, and vegetables. Ask your doctor about taking products with added fiber in them (fibersupplements).  Increase fluid intake. Drink enough water and fluids to keep your urine clear or pale yellow.   Exercise regularly.   Go to the bathroom when you have the urge to have a bowel movement. Do not wait.   Avoid straining to have bowel movements.   Keep the anal area dry and clean. Use wet toilet paper or moist towelettes after a bowel movement.   Medicated creams and suppositories may be used or applied as directed.   Only take over-the-counter or prescription medicines as directed by your caregiver.   Take warm sitz baths for 15-20 minutes, 3-4 times a day to ease pain and discomfort.   Place ice packs on the hemorrhoids if they are tender and swollen. Using ice packs between sitz baths may be helpful.   Put ice in a plastic bag.   Place a towel between your skin and the bag.   Leave the ice on for 15-20 minutes, 3-4 times a day.   Do not use a donut-shaped pillow or sit on the toilet for long periods. This increases blood pooling and pain.  SEEK MEDICAL CARE IF:  You have increasing pain and swelling that is not controlled by treatment or medicine.  You have uncontrolled bleeding.  You have difficulty or you are unable to have a bowel movement.  You have pain or inflammation outside the area of the hemorrhoids. MAKE SURE YOU:  Understand these instructions.  Will watch your condition.  Will get help right away if you are not doing well or get worse. Document Released: 08/26/2000 Document Revised: 08/15/2012 Document Reviewed: 07/03/2012 [facility] Patient Information 2015 [place], [place]. This information is not intended to replace advice given to you by your health care provider. Make sure you discuss any questions you have with your health  care provider.   Constipation Constipation is when a person has fewer than three bowel movements a week, has difficulty having a bowel movement, or has stools that are dry, hard, or larger than normal. As people grow older, constipation is more common. If you try to fix constipation with medicines that make you have a bowel movement (laxatives), the problem may get worse. Long-term laxative use may cause the muscles of the colon to become weak. A low-fiber diet, not taking in enough fluids, and taking certain medicines may make constipation worse.  CAUSES   Certain medicines, such as antidepressants, pain medicine, iron supplements, antacids, and water pills.   Certain diseases, such as diabetes, irritable bowel syndrome (IBS), thyroid disease, or depression.   Not drinking enough water.   Not eating enough fiber-rich foods.   Stress or travel.   Lack of physical activity or exercise.   Ignoring the urge to have a bowel movement.   Using laxatives too much.  SIGNS AND SYMPTOMS   Having fewer than three bowel movements a week.   Straining to have a bowel movement.   Having stools that are hard, dry, or larger than normal.   Feeling full or bloated.   Pain in the lower abdomen.   Not feeling relief after having a bowel movement.  DIAGNOSIS  Your health care provider will take a medical history and perform a physical exam. Further testing may be done for severe constipation. Some tests may include:  A barium enema X-ray to examine your rectum, colon, and, sometimes, your small intestine.   A sigmoidoscopy to examine your lower colon.   A colonoscopy to examine your entire colon. TREATMENT  Treatment will depend on the severity of your constipation and what is causing it. Some dietary treatments include drinking more fluids and eating more fiber-rich foods. Lifestyle treatments may include regular exercise. If these diet and lifestyle recommendations do not  help, your health care provider may recommend taking over-the-counter laxative medicines to help you have bowel movements. Prescription medicines may be prescribed if over-the-counter medicines do not work.  HOME CARE INSTRUCTIONS   Eat foods that have a lot of fiber, such as fruits, vegetables, whole grains, and beans.  Limit foods high in fat and processed sugars, such as french fries, hamburgers, cookies, candies, and soda.   A fiber supplement may be added to your diet if you cannot get enough fiber from foods.   Drink enough fluids to keep your urine clear or pale yellow.   Exercise regularly or as directed by your health care provider.   Go to the restroom when you have the urge to go. Do not hold it.   Only take over-the-counter or prescription medicines as directed by your health care provider. Do not take other medicines for constipation without talking to your health care provider first.  [place] IF:   You have bright red blood in your stool.   Your constipation lasts for more than 4 days or gets worse.  You have abdominal or rectal pain.   You have thin, pencil-like stools.   You have unexplained weight loss. MAKE SURE YOU:   Understand these instructions.  Will watch your condition.  Will get help right away if you are not doing well or get worse. Document Released: 05/27/2004 Document Revised: 09/03/2013 Document Reviewed: 06/10/2013 [facility] Patient Information 2015 [place], [place]. This information is not intended to replace advice given to you by your health care provider. Make sure you discuss any questions you have with your health care provider.

## 2014-04-27 NOTE — Progress Notes (Signed)
   Subjective:    Patient ID: Mary Alexander, female    DOB: Jun 26, 1927, 80 y.o.   MRN: [id number]  HPI Pt presents to the clinic to follow up on bronchitis. She is doing much better. She still is a little weak. She has finished all of her abx. She is not coughing. No fever or chills.   She has hx of UTI and kidney stones. She would like her urine checked today. It has had a strong odor for the last 2 days.   She has also had some constipation for last week or so. She has not tried anything OTC. She has been straining when she does go to bathroom. She noticed blood in stool after one bowel movement. She does have a pressure sensation around her rectum. Blood was bright red. She has not seen it again.    Review of Systems  All other systems reviewed and are negative.      Objective:   Physical Exam  Constitutional: She is oriented to person, place, and time. She appears well-developed and well-nourished.  HENT:  Head: Normocephalic and atraumatic.  Right Ear: External ear normal.  Left Ear: External ear normal.  Nose: Nose normal.  Mouth/Throat: Oropharynx is clear and moist.  Eyes: Conjunctivae are normal. Right eye exhibits no discharge. Left eye exhibits no discharge.  Neck: Normal range of motion. Neck supple.  Cardiovascular: Normal rate, regular rhythm and normal heart sounds.   Pulmonary/Chest: Effort normal and breath sounds normal.  No CVA tenderness.   Abdominal: Soft. Bowel sounds are normal. There is no tenderness. There is no rebound and no guarding.  Genitourinary:  External hemorrhoids present/non thrombosed.   Lymphadenopathy:    She has no cervical adenopathy.  Neurological: She is alert and oriented to person, place, and time.  Skin: Skin is dry.  Psychiatric: She has a normal mood and affect. Her behavior is normal.          Assessment & Plan:  Acute bronchitis- resolved. Doing well.   Abnormal urine odor/hx of kidney stones- .Marland Kitchen Results for orders  placed in visit on 04/25/14  POCT URINALYSIS DIPSTICK      Result Value Ref Range   Color, UA dark yellow     Clarity, UA clear     Glucose, UA neg     Bilirubin, UA neg     Ketones, UA neg     Spec Grav, UA >=1.030     Blood, UA neg     pH, UA 5.5     Protein, UA neg     Urobilinogen, UA 0.2     Nitrite, UA neg     Leukocytes, UA Negative     UA looks great. No blood. Follow up as needed.   Constipation/hemorrhoids- miralax 1 capful daily. High fiber diet. Drink more water. anusol given for hemorrhoids. Discussed sitz bath. Follow up if not improving or if has not had bowel movement in 5 days.

## 2014-04-30 ENCOUNTER — Other Ambulatory Visit: Payer: Self-pay | Admitting: Physician Assistant

## 2014-05-14 ENCOUNTER — Other Ambulatory Visit: Payer: Self-pay | Admitting: Physician Assistant

## 2014-05-29 ENCOUNTER — Other Ambulatory Visit: Payer: Self-pay | Admitting: Pulmonary Disease

## 2014-06-03 ENCOUNTER — Other Ambulatory Visit: Payer: Self-pay | Admitting: Physician Assistant

## 2014-06-05 ENCOUNTER — Other Ambulatory Visit: Payer: Self-pay | Admitting: Physician Assistant

## 2014-06-07 ENCOUNTER — Emergency Department
Admission: EM | Admit: 2014-06-07 | Discharge: 2014-06-07 | Disposition: A | Discharge status: Home / Self Care | Payer: Medicare Other | Source: Home / Self Care | Attending: Family Medicine | Admitting: Family Medicine

## 2014-06-07 DIAGNOSIS — R109 Unspecified abdominal pain: Secondary | ICD-10-CM

## 2014-06-07 LAB — POCT URINALYSIS DIP (MANUAL ENTRY)
BILIRUBIN UA: NEGATIVE
Blood, UA: NEGATIVE
Glucose, UA: NEGATIVE
Ketones, POC UA: NEGATIVE
Leukocytes, UA: NEGATIVE
NITRITE UA: NEGATIVE
PROTEIN UA: NEGATIVE
Spec Grav, UA: <=1.005 (ref 1.005–1.03)
Urobilinogen, UA: 0.2 (ref 0–1)
pH, UA: 5.5 (ref 5–8)

## 2014-06-07 MED ORDER — HYDROCODONE-ACETAMINOPHEN 5-325 MG PO TABS: 1.0000 | ORAL_TABLET | Freq: Four times a day (QID) | ORAL | Status: DC | PRN

## 2014-06-07 NOTE — ED Provider Notes (Signed)
Mary Alexander is a 80 y.o. female who presents to Urgent Care today for right flank pain. Patient has a one-day history of right-sided flank pain. She has a history of multiple kidney stones on that side as well as urinary tract infections. No fevers or chills nausea vomiting or diarrhea. Mild intermittent effusion is present according to her granddaughter. No medications tried yet. No urinary frequency or urgency.   Past Medical History  Diagnosis Date  . Allergic rhinitis, cause unspecified   . Unspecified sinusitis (chronic)   . Acute bronchitis   . Other chest pain   . Palpitations   . Pure hypercholesterolemia   . Unspecified hypothyroidism   . Esophageal reflux   . Benign neoplasm of colon   . Irritable bowel syndrome   . External hemorrhoids without mention of complication   . Osteoarthrosis, unspecified whether generalized or localized, unspecified site   . Lumbago   . Headache(784.0)   . Anxiety state, unspecified    History  Substance Use Topics  . Smoking status: Never Smoker   . Smokeless tobacco: Never Used  . Alcohol Use: No   ROS as above Medications: No current facility-administered medications for this encounter.   Current Outpatient Prescriptions  Medication Sig Dispense Refill  . ALPRAZolam (XANAX) 0.25 MG tablet take 1 tablet by mouth twice a day if needed  60 tablet  1  . Antipyrine-Benzocaine (AURALGAN) 54-14 MG/ML SOLN instill 2 drops into affected ear three times a day  15 mL  0  . aspirin 325 MG tablet Take 325 mg by mouth daily.      Marland Kitchen atenolol (TENORMIN) 50 MG tablet take 1 tablet by mouth once daily  30 tablet  3  . butalbital-acetaminophen-caffeine (FIORICET, ESGIC) 50-325-40 MG per tablet take 1 tablet by mouth every 6 hours if needed  14 tablet  5  . Calcium Carbonate-Vitamin D (CALTRATE 600+D) 600-400 MG-UNIT per tablet Take 1 tablet by mouth daily.        . clidinium-chlordiazePOXIDE (LIBRAX) 5-2.5 MG per capsule take 1 capsule by mouth three  times a day if needed  90 capsule  0  . clidinium-chlordiazePOXIDE (LIBRAX) 5-2.5 MG per capsule take 1 capsule by mouth three times a day if needed  90 capsule  0  . clidinium-chlordiazePOXIDE (LIBRAX) 5-2.5 MG per capsule take 1 capsule by mouth three times a day if needed  90 capsule  4  . clotrimazole-betamethasone (LOTRISONE) cream Apply topically 2 (two) times daily. Use as directed  30 g  0  . dexlansoprazole (DEXILANT) 60 MG capsule take 1 capsule by mouth every morning 30 MINUTES BEFORE 1ST MEAL OF THE DAY  30 capsule  11  . Diphenhyd-Hydrocort-Nystatin (FIRST-DUKES MOUTHWASH) SUSP 1 tsp gargle and swallow four times daily as needed  120 mL  5  . gemfibrozil (LOPID) 600 MG tablet take 1 tablet by mouth twice a day  60 tablet  3  . gemfibrozil (LOPID) 600 MG tablet take 1 tablet by mouth twice a day  60 tablet  5  . HYDROcodone-acetaminophen (NORCO/VICODIN) 5-325 MG per tablet Take 1 tablet by mouth every 6 (six) hours as needed.  10 tablet  0  . hydrocortisone 2.5 % cream apply to affected area twice a day  30 g  0  . levothyroxine (SYNTHROID, LEVOTHROID) 112 MCG tablet take 1 tablet by mouth once daily  30 tablet  1  . Multiple Vitamin (MULTIVITAMIN) capsule Take 1 capsule by mouth daily.        Marland Kitchen  nitroGLYCERIN (NITROSTAT) 0.4 MG SL tablet Place 1 tablet (0.4 mg total) under the tongue every 5 (five) minutes as needed for chest pain.  100 tablet  3  . nystatin-triamcinolone ointment (MYCOLOG) APPLY TO AFFECTED AREA 2 TO 3 TIMES A DAY FOR UP TO 2 WEEKS, THEN USE 1 TO 3 TIMES A WEEK.  30 g  PRN  . salsalate (DISALCID) 750 MG tablet take 1 tablet by mouth twice a day  60 tablet  0  . sertraline (ZOLOFT) 100 MG tablet take 1 tablet by mouth once daily  30 tablet  2  . simvastatin (ZOCOR) 40 MG tablet take 1 tablet by mouth at bedtime  30 tablet  3    Exam:  BP 142/79  Pulse 55  Temp(Src) 97.6 F (36.4 C) (Oral)  Resp 16  Ht 5\' 2"  (1.575 m)  Wt 125 lb (56.7 kg)  BMI 22.86 kg/m2  SpO2  97% Gen: Well NAD HEENT: EOMI,  MMM Lungs: Normal work of breathing. CTABL Heart: RRR no MRG Abd: NABS, Soft. Nondistended, Nontender no CV angle tenderness to percussion. No rebound or guarding present. Exts: Brisk capillary refill, warm and well perfused.  Neuro: Alert and oriented normally conversant. Not confused appearing. Normal gait.  Results for orders placed during the hospital encounter of 06/07/14 (from the past 24 hour(s))  POCT URINALYSIS DIP (MANUAL ENTRY)     Status: None   Collection Time    06/07/14  5:36 PM      Result Value Ref Range   Color, UA yellow     Clarity, UA clear     Glucose, UA neg     Bilirubin, UA negative     Bilirubin, UA negative     Spec Grav, UA <=1.005  1.005 - 1.03   Blood, UA negative     pH, UA 5.5  5 - 8   Protein Ur, POC negative     Urobilinogen, UA 0.2  0 - 1   Nitrite, UA Negative     Leukocytes, UA Negative     No results found.  Assessment and Plan: 80 y.o. female with right flank pain. Unclear etiology at this point. Suspect early kidney stone. Doubtful for infection. Urine culture pending. Discussed options. Plan for watchful waiting with a small amount of hydrocodone for pain control. Followup with PCP.  Discussed warning signs or symptoms. Please see discharge instructions. Patient expresses understanding.     Gregor Hams, MD 06/07/14 1745

## 2014-06-07 NOTE — Discharge Instructions (Signed)
Thank you for coming in today. Use Norco sparingly. This medicine will increase your chance of falls and confusion. Followup with primary care provider If your belly pain worsens, or you have high fever, bad vomiting, blood in your stool or black tarry stool go to the Emergency Room.    Flank Pain Flank pain refers to pain that is located on the side of the body between the upper abdomen and the back. The pain may occur over a short period of time (acute) or may be long-term or reoccurring (chronic). It may be mild or severe. Flank pain can be caused by many things. CAUSES  Some of the more common causes of flank pain include:  Muscle strains.   Muscle spasms.   A disease of your spine (vertebral disk disease).   A lung infection (pneumonia).   Fluid around your lungs (pulmonary edema).   A kidney infection.   Kidney stones.   A very painful skin rash caused by the chickenpox virus (shingles).   Gallbladder disease.  [place] care will depend on the cause of your pain. In general,  Rest as directed by your caregiver.  Drink enough fluids to keep your urine clear or pale yellow.  Only take over-the-counter or prescription medicines as directed by your caregiver. Some medicines may help relieve the pain.  Tell your caregiver about any changes in your pain.  Follow up with your caregiver as directed. SEEK IMMEDIATE MEDICAL CARE IF:   Your pain is not controlled with medicine.   You have new or worsening symptoms.  Your pain increases.   You have abdominal pain.   You have shortness of breath.   You have persistent nausea or vomiting.   You have swelling in your abdomen.   You feel faint or pass out.   You have blood in your urine.  You have a fever or persistent symptoms for more than 2-3 days.  You have a fever and your symptoms suddenly get worse. MAKE SURE YOU:   Understand these instructions.  Will watch your  condition.  Will get help right away if you are not doing well or get worse. Document Released: 10/20/2005 Document Revised: 05/23/2012 Document Reviewed: 04/12/2012 [facility] Patient Information 2015 [place], [place]. This information is not intended to replace advice given to you by your health care provider. Make sure you discuss any questions you have with your health care provider.

## 2014-06-13 ENCOUNTER — Telehealth (HOSPITAL_COMMUNITY): Payer: Self-pay | Admitting: Family Medicine

## 2014-06-13 LAB — URINE CULTURE: Colony Count: 70000

## 2014-06-13 MED ORDER — CIPROFLOXACIN HCL 500 MG PO TABS: 500.0000 mg | ORAL_TABLET | Freq: Two times a day (BID) | ORAL | Status: DC

## 2014-06-13 NOTE — ED Notes (Signed)
Urine culture positive for Klebsiella. I have attempted to contact the patient but was unable to reach her. I left a message asking for call back.  Gregor Hams, MD 06/13/14 [phone]

## 2014-06-13 NOTE — ED Notes (Signed)
Patient called back. Discussed urine culture results. Called in ciprofloxacin as the culture is sensitive to this antibiotic and the patient is allergic to penicillin.   Gregor Hams, MD 06/13/14 [phone]

## 2014-06-27 ENCOUNTER — Other Ambulatory Visit: Payer: Self-pay | Admitting: Physician Assistant

## 2014-06-30 ENCOUNTER — Other Ambulatory Visit: Payer: Self-pay

## 2014-06-30 DIAGNOSIS — Z1239 Encounter for other screening for malignant neoplasm of breast: Secondary | ICD-10-CM

## 2014-07-09 ENCOUNTER — Other Ambulatory Visit: Payer: Self-pay | Admitting: Physician Assistant

## 2014-07-16 ENCOUNTER — Ambulatory Visit
Admission: RE | Admit: 2014-07-16 | Discharge: 2014-07-16 | Disposition: A | Discharge status: Home / Self Care | Payer: Medicare Other | Source: Ambulatory Visit

## 2014-07-16 ENCOUNTER — Other Ambulatory Visit: Payer: Self-pay

## 2014-07-16 DIAGNOSIS — Z1231 Encounter for screening mammogram for malignant neoplasm of breast: Secondary | ICD-10-CM

## 2014-07-17 ENCOUNTER — Other Ambulatory Visit: Payer: Self-pay | Admitting: Physician Assistant

## 2014-07-18 ENCOUNTER — Other Ambulatory Visit: Payer: Self-pay | Admitting: Family Medicine

## 2014-07-22 ENCOUNTER — Other Ambulatory Visit: Payer: Self-pay | Admitting: Physician Assistant

## 2014-08-06 ENCOUNTER — Ambulatory Visit (INDEPENDENT_AMBULATORY_CARE_PROVIDER_SITE_OTHER): Payer: Medicare Other | Admitting: Physician Assistant

## 2014-08-06 ENCOUNTER — Encounter: Payer: Self-pay | Admitting: Physician Assistant

## 2014-08-06 VITALS — BP 125/70 | HR 80 | Temp 97.7°F | Wt 127.0 lb

## 2014-08-06 DIAGNOSIS — J208 Acute bronchitis due to other specified organisms: Secondary | ICD-10-CM

## 2014-08-06 MED ORDER — PREDNISONE 20 MG PO TABS: ORAL_TABLET | ORAL | Status: DC

## 2014-08-06 MED ORDER — ALBUTEROL SULFATE (2.5 MG/3ML) 0.083% IN NEBU: 2.5000 mg | INHALATION_SOLUTION | RESPIRATORY_TRACT | Status: AC | PRN

## 2014-08-06 MED ORDER — HYDROCOD POLST-CHLORPHEN POLST 10-8 MG/5ML PO LQCR: 5.0000 mL | Freq: Two times a day (BID) | ORAL | Status: DC | PRN

## 2014-08-06 NOTE — Progress Notes (Signed)
   Subjective:    Patient ID: Mary Alexander, female    DOB: 08-07-1927, 80 y.o.   MRN: [id number]  HPI  Pt is a 80 yo female who presents to the clinic still stick after fast med visit on Saturday. She was started on levaquin and doing nebulizer regularly. She has little improvement but some. Cough keeps her up at night. Not as productive as was before abx. Chest still feels tight and notices wheezing. No fever, chills. Very fatigued.     Review of Systems  All other systems reviewed and are negative.      Objective:   Physical Exam  Constitutional: She is oriented to person, place, and time. She appears well-developed and well-nourished.  HENT:  Head: Normocephalic and atraumatic.  Right Ear: External ear normal.  Left Ear: External ear normal.  Nose: Nose normal.  Mouth/Throat: Oropharynx is clear and moist.  Eyes: Conjunctivae are normal.  Neck: Normal range of motion. Neck supple.  Cardiovascular: Normal rate, regular rhythm and normal heart sounds.   Pulmonary/Chest:  Mild wheezing bilateral lungs both inspiratory and expiratory.   Lymphadenopathy:    She has no cervical adenopathy.  Neurological: She is alert and oriented to person, place, and time.  Psychiatric: She has a normal mood and affect. Her behavior is normal.          Assessment & Plan:  Acute bronchitis- prednisone taper added to levquin.finish abx. Cough syrup given for night cough. Continue to use nebulizer every 4 hours for next 3 days then as needed. Refill given on nebulizer samples. Follow up if worsening or not improving.

## 2014-08-11 ENCOUNTER — Other Ambulatory Visit: Payer: Self-pay | Admitting: Physician Assistant

## 2014-08-15 ENCOUNTER — Other Ambulatory Visit: Payer: Self-pay | Admitting: *Deleted

## 2014-08-15 MED ORDER — FIRST-DUKES MOUTHWASH MT SUSP: OROMUCOSAL | Status: DC

## 2014-08-18 ENCOUNTER — Other Ambulatory Visit: Payer: Self-pay | Admitting: *Deleted

## 2014-08-18 MED ORDER — FLUCONAZOLE 100 MG PO TABS: ORAL_TABLET | ORAL | Status: DC

## 2014-08-22 ENCOUNTER — Encounter: Payer: Self-pay | Admitting: Physician Assistant

## 2014-08-22 ENCOUNTER — Ambulatory Visit (INDEPENDENT_AMBULATORY_CARE_PROVIDER_SITE_OTHER): Payer: Medicare Other

## 2014-08-22 ENCOUNTER — Ambulatory Visit (INDEPENDENT_AMBULATORY_CARE_PROVIDER_SITE_OTHER): Payer: Medicare Other | Admitting: Physician Assistant

## 2014-08-22 VITALS — BP 119/61 | HR 70 | Ht 62.0 in | Wt 125.0 lb

## 2014-08-22 DIAGNOSIS — R059 Cough, unspecified: Secondary | ICD-10-CM

## 2014-08-22 DIAGNOSIS — R112 Nausea with vomiting, unspecified: Secondary | ICD-10-CM

## 2014-08-22 DIAGNOSIS — N390 Urinary tract infection, site not specified: Secondary | ICD-10-CM

## 2014-08-22 DIAGNOSIS — R404 Transient alteration of awareness: Secondary | ICD-10-CM

## 2014-08-22 DIAGNOSIS — R05 Cough: Secondary | ICD-10-CM

## 2014-08-22 DIAGNOSIS — R82998 Other abnormal findings in urine: Secondary | ICD-10-CM

## 2014-08-22 DIAGNOSIS — R5383 Other fatigue: Secondary | ICD-10-CM

## 2014-08-22 LAB — POCT URINALYSIS DIPSTICK
Bilirubin, UA: NEGATIVE
Blood, UA: NEGATIVE
GLUCOSE UA: NEGATIVE
KETONES UA: NEGATIVE
Nitrite, UA: NEGATIVE
PROTEIN UA: NEGATIVE
Spec Grav, UA: 1.015
Urobilinogen, UA: 0.2
pH, UA: 5.5

## 2014-08-22 MED ORDER — NITROFURANTOIN MONOHYD MACRO 100 MG PO CAPS: 100.0000 mg | ORAL_CAPSULE | Freq: Two times a day (BID) | ORAL | Status: DC

## 2014-08-22 MED ORDER — ONDANSETRON HCL 4 MG PO TABS: 4.0000 mg | ORAL_TABLET | Freq: Three times a day (TID) | ORAL | Status: DC | PRN

## 2014-08-22 NOTE — Progress Notes (Signed)
Subjective:    Patient ID: Mary Alexander, female    DOB: 1926/10/30, 80 y.o.   MRN: [id number]  HPI  Pt is a 80 yo female who presents to the clinic with daughter and grandaughter. She was diagnosed with bronchitis a week or so ago. Pt's family states she has been weak since. Continues to cough a little. No fever. Having problems with gait. Very unstable. Episode of vomiting last 2 days. Seems to have resolved. Stools are loose and frequent. No appetitie. Seems confussed. Hx of stroke. No unilateral weakness or speech difficulties.     Review of Systems  All other systems reviewed and are negative.      Objective:   Physical Exam  Constitutional: She is oriented to person, place, and time. She appears well-developed and well-nourished.  HENT:  Head: Normocephalic and atraumatic.  Right Ear: External ear normal.  Left Ear: External ear normal.  Nose: Nose normal.  Mouth/Throat: Oropharynx is clear and moist. No oropharyngeal exudate.  Eyes: Conjunctivae and EOM are normal. Pupils are equal, round, and reactive to light. Right eye exhibits no discharge. Left eye exhibits no discharge.  Neck: Normal range of motion. Neck supple.  Cardiovascular: Normal rate, regular rhythm and normal heart sounds.   Pulmonary/Chest: Effort normal and breath sounds normal. She has no wheezes.  Musculoskeletal:  Upper extremity strength bilateral and symmetric.  Lower extremity strength bilateral and symmetric.   Lymphadenopathy:    She has no cervical adenopathy.  Neurological: She is alert and oriented to person, place, and time.  Gait altered. Pt needs a person or walker for stablization.   Skin: Skin is dry.  Psychiatric: She has a normal mood and affect. Her behavior is normal.          Assessment & Plan:  Altered gait/decreased strength/cough/nausea- will check cbc, cmp. Will get CXR to make sure lungs clear. .. Results for orders placed or performed in visit on 08/22/14  Urine  Culture  Result Value Ref Range   Colony Count NO GROWTH    Organism ID, Bacteria NO GROWTH   CBC w/Diff  Result Value Ref Range   WBC 9.2 4.0 - 10.5 K/uL   RBC 4.61 3.87 - 5.11 MIL/uL   Hemoglobin 12.3 12.0 - 15.0 g/dL   HCT 36.9 36.0 - 46.0 %   MCV 80.0 78.0 - 100.0 fL   MCH 26.7 26.0 - 34.0 pg   MCHC 33.3 30.0 - 36.0 g/dL   RDW 16.9 (H) 11.5 - 15.5 %   Platelets 299 150 - 400 K/uL   MPV 10.7 9.4 - 12.4 fL   Neutrophils Relative % 66 43 - 77 %   Neutro Abs 6.1 1.7 - 7.7 K/uL   Lymphocytes Relative 21 12 - 46 %   Lymphs Abs 1.9 0.7 - 4.0 K/uL   Monocytes Relative 10 3 - 12 %   Monocytes Absolute 0.9 0.1 - 1.0 K/uL   Eosinophils Relative 2 0 - 5 %   Eosinophils Absolute 0.2 0.0 - 0.7 K/uL   Basophils Relative 1 0 - 1 %   Basophils Absolute 0.1 0.0 - 0.1 K/uL   Smear Review Criteria for review not met   COMPLETE METABOLIC PANEL WITH GFR  Result Value Ref Range   Sodium 138 135 - 145 mEq/L   Potassium 4.7 3.5 - 5.3 mEq/L   Chloride 102 96 - 112 mEq/L   CO2 28 19 - 32 mEq/L   Glucose, Bld 123 (H) 70 -  99 mg/dL   BUN 20 6 - 23 mg/dL   Creat 1.39 (H) 0.50 - 1.10 mg/dL   Total Bilirubin 0.3 0.2 - 1.2 mg/dL   Alkaline Phosphatase 79 39 - 117 U/L   AST 13 0 - 37 U/L   ALT <8 0 - 35 U/L   Total Protein 6.4 6.0 - 8.3 g/dL   Albumin 3.9 3.5 - 5.2 g/dL   Calcium 9.1 8.4 - 10.5 mg/dL   GFR, Est African American 40 (L) mL/min   GFR, Est Non African American 35 (L) mL/min  POCT urinalysis dipstick  Result Value Ref Range   Color, UA yellow    Clarity, UA clear    Glucose, UA neg    Bilirubin, UA neg    Ketones, UA neg    Spec Grav, UA 1.015    Blood, UA neg    pH, UA 5.5    Protein, UA neg    Urobilinogen, UA 0.2    Nitrite, UA neg    Leukocytes, UA Trace    Urine did show trace. Will get culture. Go ahead and start macrobid to treat for UTI. Encouraged pt to push fluids and keep nutrition up. She is recoving from bad URI could be weak from symptoms. Pt's family concerned  with stroke. Hx of stroke. No unilateral symptoms. If persist will get imaging of head. Does not appear like stroke. On daily ASA and statin. Pt labs have been great. No physical exam signs of stroke.

## 2014-08-23 LAB — COMPLETE METABOLIC PANEL WITH GFR
ALK PHOS: 79 U/L (ref 39–117)
ALT: <8 U/L (ref 0–35)
AST: 13 U/L (ref 0–37)
Albumin: 3.9 g/dL (ref 3.5–5.2)
BILIRUBIN TOTAL: 0.3 mg/dL (ref 0.2–1.2)
BUN: 20 mg/dL (ref 6–23)
CO2: 28 mEq/L (ref 19–32)
Calcium: 9.1 mg/dL (ref 8.4–10.5)
Chloride: 102 mEq/L (ref 96–112)
Creat: 1.39 mg/dL — ABNORMAL HIGH (ref 0.50–1.10)
GFR, EST NON AFRICAN AMERICAN: 35 mL/min — AB
GFR, Est African American: 40 mL/min — ABNORMAL LOW
Glucose, Bld: 123 mg/dL — ABNORMAL HIGH (ref 70–99)
Potassium: 4.7 mEq/L (ref 3.5–5.3)
Sodium: 138 mEq/L (ref 135–145)
Total Protein: 6.4 g/dL (ref 6.0–8.3)

## 2014-08-23 LAB — CBC WITH DIFFERENTIAL/PLATELET
BASOS ABS: 0.1 10*3/uL (ref 0.0–0.1)
Basophils Relative: 1 % (ref 0–1)
EOS ABS: 0.2 10*3/uL (ref 0.0–0.7)
Eosinophils Relative: 2 % (ref 0–5)
HCT: 36.9 % (ref 36.0–46.0)
Hemoglobin: 12.3 g/dL (ref 12.0–15.0)
Lymphocytes Relative: 21 % (ref 12–46)
Lymphs Abs: 1.9 10*3/uL (ref 0.7–4.0)
MCH: 26.7 pg (ref 26.0–34.0)
MCHC: 33.3 g/dL (ref 30.0–36.0)
MCV: 80 fL (ref 78.0–100.0)
MPV: 10.7 fL (ref 9.4–12.4)
Monocytes Absolute: 0.9 10*3/uL (ref 0.1–1.0)
Monocytes Relative: 10 % (ref 3–12)
NEUTROS ABS: 6.1 10*3/uL (ref 1.7–7.7)
Neutrophils Relative %: 66 % (ref 43–77)
Platelets: 299 10*3/uL (ref 150–400)
RBC: 4.61 MIL/uL (ref 3.87–5.11)
RDW: 16.9 % — AB (ref 11.5–15.5)
WBC: 9.2 10*3/uL (ref 4.0–10.5)

## 2014-08-24 LAB — URINE CULTURE
Colony Count: NO GROWTH
Organism ID, Bacteria: NO GROWTH

## 2014-08-25 ENCOUNTER — Telehealth: Payer: Self-pay | Admitting: *Deleted

## 2014-08-25 NOTE — Telephone Encounter (Signed)
Gwinda Passe called that Evany had a fever Saturday night of 102 and she fell last evening and has been sleeping a lot more. Please advise. Margette Fast, CMa

## 2014-08-25 NOTE — Telephone Encounter (Signed)
We have checked CXR, UA and CBC. Certainly recheck cmp for kidney function. At this point she could also be battling a viral infection. Keep posted on fever. Keep pushing fluids. Call in 2 days with status or any symptom changes.

## 2014-08-26 NOTE — Telephone Encounter (Signed)
I spoke with Gwinda Passe today and she will call by the end of the week if anything changes. Margette Fast, CMA

## 2014-09-09 ENCOUNTER — Other Ambulatory Visit: Payer: Self-pay | Admitting: Physician Assistant

## 2014-09-10 ENCOUNTER — Other Ambulatory Visit: Payer: Self-pay | Admitting: Physician Assistant

## 2014-09-19 ENCOUNTER — Other Ambulatory Visit: Payer: Self-pay | Admitting: Family Medicine

## 2014-09-24 ENCOUNTER — Other Ambulatory Visit: Payer: Self-pay | Admitting: Pulmonary Disease

## 2014-10-16 ENCOUNTER — Other Ambulatory Visit: Payer: Self-pay | Admitting: Physician Assistant

## 2014-10-16 NOTE — Telephone Encounter (Signed)
Needs f/u labs before future refills

## 2014-10-22 ENCOUNTER — Other Ambulatory Visit: Payer: Self-pay | Admitting: Physician Assistant

## 2014-10-30 ENCOUNTER — Ambulatory Visit (INDEPENDENT_AMBULATORY_CARE_PROVIDER_SITE_OTHER): Payer: Self-pay | Admitting: Physician Assistant

## 2014-10-30 DIAGNOSIS — R32 Unspecified urinary incontinence: Secondary | ICD-10-CM

## 2014-10-30 DIAGNOSIS — R350 Frequency of micturition: Secondary | ICD-10-CM

## 2014-10-30 DIAGNOSIS — N390 Urinary tract infection, site not specified: Secondary | ICD-10-CM

## 2014-10-30 DIAGNOSIS — R82998 Other abnormal findings in urine: Secondary | ICD-10-CM

## 2014-10-30 DIAGNOSIS — R319 Hematuria, unspecified: Secondary | ICD-10-CM

## 2014-10-30 LAB — POCT URINALYSIS DIPSTICK
Bilirubin, UA: NEGATIVE
GLUCOSE UA: NEGATIVE
Ketones, UA: NEGATIVE
Nitrite, UA: POSITIVE
PH UA: 6
Protein, UA: 100
SPEC GRAV UA: 1.02
Urobilinogen, UA: 0.2

## 2014-10-30 NOTE — Progress Notes (Signed)
   Subjective:    Patient ID: Mary Alexander, female    DOB: December 22, 1926, 81 y.o.   MRN: [id number] Patient's daughter dropped off a urine sample to [place] to dipstick. Pt has appt tomorrow with Luvenia Starch.  Urinalysis done and urine culture ordered.  Beatris Ship, CMA HPI    Review of Systems     Objective:   Physical Exam        Assessment & Plan:  .Marland Kitchen Results for orders placed or performed in visit on 10/30/14  POCT urinalysis dipstick  Result Value Ref Range   Color, UA yellow    Clarity, UA turbid    Glucose, UA neg    Bilirubin, UA neg    Ketones, UA neg    Spec Grav, UA 1.020    Blood, UA moderate    pH, UA 6.0    Protein, UA 100    Urobilinogen, UA 0.2    Nitrite, UA positive    Leukocytes, UA small (1+)    Coming in for appt tomorrow. Will discuss and treat tomorrow.

## 2014-10-31 ENCOUNTER — Encounter: Payer: Self-pay | Admitting: Physician Assistant

## 2014-10-31 ENCOUNTER — Ambulatory Visit (INDEPENDENT_AMBULATORY_CARE_PROVIDER_SITE_OTHER): Payer: Medicare Other | Admitting: Physician Assistant

## 2014-10-31 VITALS — BP 111/62 | HR 72 | Ht 62.0 in | Wt 125.0 lb

## 2014-10-31 DIAGNOSIS — K219 Gastro-esophageal reflux disease without esophagitis: Secondary | ICD-10-CM

## 2014-10-31 DIAGNOSIS — R350 Frequency of micturition: Secondary | ICD-10-CM | POA: Diagnosis not present

## 2014-10-31 DIAGNOSIS — L821 Other seborrheic keratosis: Secondary | ICD-10-CM

## 2014-10-31 DIAGNOSIS — E039 Hypothyroidism, unspecified: Secondary | ICD-10-CM | POA: Diagnosis not present

## 2014-10-31 DIAGNOSIS — E7800 Pure hypercholesterolemia, unspecified: Secondary | ICD-10-CM

## 2014-10-31 DIAGNOSIS — F411 Generalized anxiety disorder: Secondary | ICD-10-CM

## 2014-10-31 DIAGNOSIS — N3001 Acute cystitis with hematuria: Secondary | ICD-10-CM | POA: Diagnosis not present

## 2014-10-31 DIAGNOSIS — R8290 Unspecified abnormal findings in urine: Secondary | ICD-10-CM | POA: Diagnosis not present

## 2014-10-31 DIAGNOSIS — R002 Palpitations: Secondary | ICD-10-CM

## 2014-10-31 MED ORDER — ATENOLOL 50 MG PO TABS: 50.0000 mg | ORAL_TABLET | Freq: Every day | ORAL | Status: DC

## 2014-10-31 MED ORDER — NITROFURANTOIN MONOHYD MACRO 100 MG PO CAPS: 100.0000 mg | ORAL_CAPSULE | Freq: Two times a day (BID) | ORAL | Status: DC

## 2014-10-31 MED ORDER — CILIDINIUM-CHLORDIAZEPOXIDE 2.5-5 MG PO CAPS: ORAL_CAPSULE | ORAL | Status: DC

## 2014-10-31 MED ORDER — FLUCONAZOLE 100 MG PO TABS: ORAL_TABLET | ORAL | Status: DC

## 2014-10-31 MED ORDER — DEXLANSOPRAZOLE 60 MG PO CPDR: DELAYED_RELEASE_CAPSULE | ORAL | Status: DC

## 2014-10-31 MED ORDER — SALSALATE 750 MG PO TABS: 750.0000 mg | ORAL_TABLET | Freq: Two times a day (BID) | ORAL | Status: DC

## 2014-10-31 MED ORDER — BUTALBITAL-APAP-CAFFEINE 50-325-40 MG PO TABS: ORAL_TABLET | ORAL | Status: DC

## 2014-10-31 MED ORDER — SIMVASTATIN 40 MG PO TABS: 40.0000 mg | ORAL_TABLET | Freq: Every day | ORAL | Status: DC

## 2014-10-31 MED ORDER — ALPRAZOLAM 0.25 MG PO TABS: ORAL_TABLET | ORAL | Status: DC

## 2014-10-31 MED ORDER — ONDANSETRON HCL 4 MG PO TABS: 4.0000 mg | ORAL_TABLET | Freq: Three times a day (TID) | ORAL | Status: DC | PRN

## 2014-10-31 NOTE — Patient Instructions (Signed)

## 2014-10-31 NOTE — Progress Notes (Addendum)
   Subjective:    Patient ID: Mary Alexander, female    DOB: 1926-11-16, 81 y.o.   MRN: [id number]  HPI Patient is an 81 year old female who presents to the clinic with cloudy urine, increased urine frequency, nausea, lower abdominal pressure and some fatigue. She denies any fever or chills. She is not taking anything to make better.  Patient also needs some med refills today. She would like refills on everything to get her physical in 6 months.  She has some rough spots on her face that bother her and wants dermatology referral. Do not bleed.    Review of Systems  All other systems reviewed and are negative.      Objective:   Physical Exam  Constitutional: She is oriented to person, place, and time. She appears well-developed and well-nourished.  HENT:  Head: Normocephalic and atraumatic.  Cardiovascular: Normal rate, regular rhythm and normal heart sounds.   Pulmonary/Chest: Effort normal and breath sounds normal.  Bilateral CVA tenderness.   Abdominal: Soft. Bowel sounds are normal.  Discomfort over suprapubic area.   Neurological: She is alert and oriented to person, place, and time.  Skin: Skin is dry.  Psychiatric: She has a normal mood and affect. Her behavior is normal.          Assessment & Plan:  Discussed need for complete physical in the next 6 months.  Acute cystitis- .. Results for orders placed or performed in visit on 10/30/14  POCT urinalysis dipstick  Result Value Ref Range   Color, UA yellow    Clarity, UA turbid    Glucose, UA neg    Bilirubin, UA neg    Ketones, UA neg    Spec Grav, UA 1.020    Blood, UA moderate    pH, UA 6.0    Protein, UA 100    Urobilinogen, UA 0.2    Nitrite, UA positive    Leukocytes, UA small (1+)    Will culture treated with Macrobid for 7 days. Increase hydration. Follow-up if not improving. Zofran given for any nausea. Diflucan was given for any residual yeast infection after antibiotic usage.    Hypothyroidism-not been checked in the last 6 months and last check was elevated at 48. We'll recheck today.  Anxiety-patient has stop Zoloft daily. She does take 0.25 mg of Xanax every night before bed. Denies any side effects.  Hypercholesterolemia-we'll recheck a complete physical in 6 months. Refilled until then.  GERD-refilled excellent for next 6 months.  Migraine- refilled Fioricet for as needed.   Seborrheic keratosis- on face discussed cryotherpay. Wants to see derm. Referral made.

## 2014-11-01 LAB — TSH: TSH: 0.29 u[IU]/mL — AB (ref 0.350–4.500)

## 2014-11-02 LAB — URINE CULTURE: Colony Count: >=100000

## 2014-11-03 ENCOUNTER — Other Ambulatory Visit: Payer: Self-pay | Admitting: *Deleted

## 2014-11-03 DIAGNOSIS — L821 Other seborrheic keratosis: Secondary | ICD-10-CM | POA: Insufficient documentation

## 2014-11-03 MED ORDER — LEVOTHYROXINE SODIUM 100 MCG PO TABS: 100.0000 ug | ORAL_TABLET | Freq: Every day | ORAL | Status: DC

## 2014-11-03 NOTE — Addendum Note (Signed)
Addended by: Donella Stade on: 11/03/2014 06:52 AM   Modules accepted: Orders

## 2014-11-04 ENCOUNTER — Telehealth: Payer: Self-pay | Admitting: *Deleted

## 2014-11-04 NOTE — Telephone Encounter (Signed)
Concerned for dehydration at this point. Yellow urine and weakness. She needs fluids. Go to urgent care for fluids or ER.

## 2014-11-04 NOTE — Telephone Encounter (Signed)
Pt's daughter left vm stating that Afia's urine looks like "pineapple juice" and she is extremely weak & feels terrible.  She wanted to know if she possibly needed another abx or maybe some fluids.  Please advise.

## 2014-11-04 NOTE — Telephone Encounter (Signed)
Pt's daughter notified and urged to bring her to our Urgent Care.

## 2014-11-06 ENCOUNTER — Telehealth: Payer: Self-pay | Admitting: *Deleted

## 2014-11-06 NOTE — Telephone Encounter (Signed)
Patients daughter called stating that the pt has finished the Macrobid and her symptoms have improved with the exception of urinary frequency. She says that her urine is all still very cloudy. She wanted to know if she needed another round of antibiotics. Please advise. Darla Lesches, [place]

## 2014-11-07 ENCOUNTER — Encounter: Payer: Self-pay | Admitting: Physician Assistant

## 2014-11-07 ENCOUNTER — Other Ambulatory Visit: Payer: Self-pay

## 2014-11-07 ENCOUNTER — Ambulatory Visit: Payer: Medicare Other | Admitting: Family Medicine

## 2014-11-07 MED ORDER — CEPHALEXIN 500 MG PO CAPS: 500.0000 mg | ORAL_CAPSULE | Freq: Two times a day (BID) | ORAL | Status: DC

## 2014-11-07 MED ORDER — FLUCONAZOLE 100 MG PO TABS: ORAL_TABLET | ORAL | Status: DC

## 2014-11-07 NOTE — Telephone Encounter (Signed)
Come in today or just bring in a sample. I do want to test it. Does not need appt.

## 2014-11-10 ENCOUNTER — Encounter: Payer: Self-pay | Admitting: Physician Assistant

## 2014-11-10 ENCOUNTER — Ambulatory Visit (INDEPENDENT_AMBULATORY_CARE_PROVIDER_SITE_OTHER): Payer: Medicare Other | Admitting: Physician Assistant

## 2014-11-10 ENCOUNTER — Ambulatory Visit (INDEPENDENT_AMBULATORY_CARE_PROVIDER_SITE_OTHER): Payer: Medicare Other | Admitting: Sports Medicine

## 2014-11-10 ENCOUNTER — Ambulatory Visit (INDEPENDENT_AMBULATORY_CARE_PROVIDER_SITE_OTHER): Payer: Medicare Other

## 2014-11-10 VITALS — BP 130/81 | HR 73 | Temp 97.9°F | Ht 62.0 in | Wt 120.0 lb

## 2014-11-10 DIAGNOSIS — R350 Frequency of micturition: Secondary | ICD-10-CM | POA: Diagnosis not present

## 2014-11-10 DIAGNOSIS — E860 Dehydration: Secondary | ICD-10-CM

## 2014-11-10 DIAGNOSIS — R531 Weakness: Secondary | ICD-10-CM

## 2014-11-10 DIAGNOSIS — R05 Cough: Secondary | ICD-10-CM

## 2014-11-10 DIAGNOSIS — N3001 Acute cystitis with hematuria: Secondary | ICD-10-CM

## 2014-11-10 DIAGNOSIS — R059 Cough, unspecified: Secondary | ICD-10-CM

## 2014-11-10 LAB — POCT URINALYSIS DIPSTICK
Bilirubin, UA: NEGATIVE
Glucose, UA: NEGATIVE
KETONES UA: NEGATIVE
Nitrite, UA: NEGATIVE
Protein, UA: 100
SPEC GRAV UA: 1.025
UROBILINOGEN UA: 0.2
pH, UA: 6

## 2014-11-10 MED ORDER — AMBULATORY NON FORMULARY MEDICATION: Status: DC

## 2014-11-10 MED ORDER — ONDANSETRON HCL 4 MG PO TABS: 4.0000 mg | ORAL_TABLET | Freq: Three times a day (TID) | ORAL | Status: AC | PRN

## 2014-11-10 MED ORDER — CEFTRIAXONE SODIUM 1 G IJ SOLR
1.0000 g | Freq: Once | INTRAMUSCULAR | Status: AC
  Administered 2014-11-10: 1 g via INTRAMUSCULAR

## 2014-11-10 NOTE — Progress Notes (Signed)
  Subjective:    CC: Dehydration  HPI: This is a pleasant 81 year old female, for sometime now she's had dysuria, with leukocytes and a urine culture that grew out Escherichia coli, relatively resistant. She has failed a course of cephalexin, and Macrobid. She does have some mild altered mental status but no abdominal pain, GI symptoms, or constitutional symptoms. She is being managed by her primary physician and I was called for further evaluation regarding dehydration and possible need for IV fluids. She does have a mild cough. No history of heart failure.  Past medical history, Surgical history, Family history not pertinant except as noted below, Social history, Allergies, and medications have been entered into the medical record, reviewed, and no changes needed.   Review of Systems: No fevers, chills, night sweats, weight loss, chest pain, or shortness of breath.   Objective:    General: Well Developed, well nourished, and in no acute distress.  Neuro: Alert and oriented x3, extra-ocular muscles intact, sensation grossly intact.  HEENT: Normocephalic, atraumatic, pupils equal round reactive to light, neck supple, no masses, no lymphadenopathy, thyroid nonpalpable. Dry oral mucosa. Skin: Warm and dry, no rashes. Poor skin turgor. Cardiac: Regular rate and rhythm, no murmurs rubs or gallops, no lower extremity edema.  Respiratory: Coarse sounds in the left lung fields. Not using accessory muscles, speaking in full sentences.  A 20-gauge Angiocath was placed in the left great saphenous vein, and 2 L of normal saline was run. Patient improved significantly after IV fluids.  Impression and Recommendations:    I spent 40 minutes with this patient, greater than 50% was face-to-face time counseling regarding the above diagnosis.

## 2014-11-10 NOTE — Telephone Encounter (Signed)
Pt coming in for a visit today.

## 2014-11-10 NOTE — Assessment & Plan Note (Signed)
2 L of normal saline run through the left great saphenous pain. Patient improved significantly. She does need antibiotics, she was given ceftriaxone 1 g today, her daughter is a Marine scientist, and could theoretically give her a gram of Rocephin intramuscular every 24 hours for 5 days. I would also recommend a chest x-ray. Further management per primary.

## 2014-11-10 NOTE — Progress Notes (Signed)
   Subjective:    Patient ID: EVALIE HARGRAVES, female    DOB: 1926/12/13, 81 y.o.   MRN: [id number]  HPI Patient is a pleasant 81 year old female who presents to the clinic with her daughter and granddaughter. She was treated for urinary tract infection last week and has not responded well to Macrobid or Keflex. We have checked culture and both should be sensitive. Patient continues to complain of frequent urination, pressure, mild back pain, weakness and confusion. Her daughter states she has not been able to walk for the last 3 days. She did fall on Friday. She has no appetite. She is drinking some but very little. She was able to get one Ensure in her today. She denies any fever. She has had some chills off and on.   Review of Systems  All other systems reviewed and are negative.      Objective:   Physical Exam  Constitutional: She appears well-developed and well-nourished.  Weak and pale.   HENT:  Head: Normocephalic and atraumatic.  Dry mucous membranes.   Pulmonary/Chest: Effort normal. She has no wheezes.  Coarse breath sound in left lung.   No CVa tenderness.   Abdominal: Soft. Bowel sounds are normal.  Mild tenderness over lower abdomen to palpation. No guarding or rebound.   Neurological: She is alert.  Skin: Skin is dry.  Psychiatric: She has a normal mood and affect.          Assessment & Plan:  Acute cystitis- .. Results for orders placed or performed in visit on 11/10/14  POCT urinalysis dipstick  Result Value Ref Range   Color, UA yellow    Clarity, UA turbid    Glucose, UA neg    Bilirubin, UA neg    Ketones, UA neg    Spec Grav, UA 1.025    Blood, UA large    pH, UA 6.0    Protein, UA 100 mg/dl    Urobilinogen, UA 0.2    Nitrite, UA neg    Leukocytes, UA moderate (2+)    1 gram of rocephin given today. 7 more days sent home with daughter to given daily for 7 days total.  Stop keflex.  zofran for nausea given.  Continue to hydrate orally.   Follow up in 3 days.   Dehydration- 2 L of fluids given by Dr. Dianah Field. Pt responded well and feeling much better.   Cough- will get stat CXR to evaluate for pneumonia.

## 2014-11-11 ENCOUNTER — Ambulatory Visit: Payer: Medicare Other | Admitting: Physician Assistant

## 2014-11-14 ENCOUNTER — Other Ambulatory Visit: Payer: Self-pay | Admitting: Physician Assistant

## 2014-11-21 ENCOUNTER — Encounter: Payer: Self-pay | Admitting: Physician Assistant

## 2014-11-21 ENCOUNTER — Ambulatory Visit (INDEPENDENT_AMBULATORY_CARE_PROVIDER_SITE_OTHER): Payer: Medicare Other | Admitting: Physician Assistant

## 2014-11-21 VITALS — BP 122/73 | HR 82 | Resp 16 | Wt 119.0 lb

## 2014-11-21 DIAGNOSIS — R410 Disorientation, unspecified: Secondary | ICD-10-CM | POA: Diagnosis not present

## 2014-11-21 DIAGNOSIS — F0390 Unspecified dementia without behavioral disturbance: Secondary | ICD-10-CM

## 2014-11-21 DIAGNOSIS — N3281 Overactive bladder: Secondary | ICD-10-CM

## 2014-11-21 DIAGNOSIS — R350 Frequency of micturition: Secondary | ICD-10-CM | POA: Diagnosis not present

## 2014-11-21 DIAGNOSIS — I679 Cerebrovascular disease, unspecified: Secondary | ICD-10-CM

## 2014-11-21 NOTE — Patient Instructions (Signed)
Will make referral for neurology.  Start daily preventive UTI>  Start daily OAB medication.

## 2014-11-21 NOTE — Progress Notes (Signed)
   Subjective:    Patient ID: Mary Alexander, female    DOB: Apr 03, 1927, 81 y.o.   MRN: [id number]  HPI  Pt presents to the clinic with her daughter. Daughter has noticed that after her last UTI with 7 days of rocephin shots she is more confused than usual. Less confused than was while battling infection. Feeling much better. No abdominal pain, flank pain, or dysuria.  Daughter aware she did have UTI symptoms for many days febuarary 20th until just a week or so ago. She is away some confusion is normal during infections but she feels better with symptoms. She still has frequent urination day and night. She at least goes every 2-3 hours with lots of urgency. Does not leak or have accidents frequently. No abdominal pain or back pain. She was seen Jan 2012 at [facility] with Dr. Angelina Sheriff but he retired. She was diagnosed with dementia and started on excelon patch. Never tried anything else. Per daughter she call her daughter her sister. She wants to go to her mothers house for dinner but mother been dead for many years. She frequently says things that do not make sense. She can get frustrated but no aggressive behaviors.     Review of Systems  All other systems reviewed and are negative.      Objective:   Physical Exam  Constitutional: She is oriented to person, place, and time. She appears well-developed and well-nourished.  HENT:  Head: Normocephalic and atraumatic.  Cardiovascular: Normal rate, regular rhythm and normal heart sounds.   Pulmonary/Chest: Effort normal and breath sounds normal. She has no wheezes.  No CVA tenderness.   Abdominal: Soft. Bowel sounds are normal. She exhibits no distension and no mass. There is no tenderness. There is no rebound and no guarding.  Neurological: She is alert and oriented to person, place, and time.  Skin: Skin is dry.  Psychiatric: She has a normal mood and affect. Her behavior is normal.          Assessment & Plan:  Dementia, no  behavioral disturbances- mini-mental status 26/30 for high school education fairly good. After discussion with daughter sounds like confustion is progressing. Should be consider namenda vs aricept. Daughter would like referral for another neurologist. Confusion from infection could be improving. Hx of Stroke and CAD.     UTI, recurrent- this UTI has really set her back. Will start preventative trimethoprim 100mg  daily.   OAB/urinary frequently- ..  discussed options for OAB. Pt ok with started ditropan XR 5mg  daily. Discussed side effects. Follow up in 4-6 weeks. daugther brings in paperwork to be filled out so pt can miss Mondays as needed to help care for her mother. It is the only day of week that she is not covered for.

## 2014-11-22 ENCOUNTER — Other Ambulatory Visit: Payer: Self-pay | Admitting: Physician Assistant

## 2014-11-24 DIAGNOSIS — N3281 Overactive bladder: Secondary | ICD-10-CM | POA: Insufficient documentation

## 2014-11-24 DIAGNOSIS — F0390 Unspecified dementia without behavioral disturbance: Secondary | ICD-10-CM | POA: Insufficient documentation

## 2014-11-24 MED ORDER — OXYBUTYNIN CHLORIDE ER 5 MG PO TB24: 5.0000 mg | ORAL_TABLET | Freq: Every day | ORAL | Status: DC

## 2014-11-24 MED ORDER — TRIMETHOPRIM 100 MG PO TABS: 100.0000 mg | ORAL_TABLET | Freq: Every day | ORAL | Status: DC

## 2014-11-25 ENCOUNTER — Telehealth: Payer: Self-pay | Admitting: *Deleted

## 2014-11-25 ENCOUNTER — Other Ambulatory Visit: Payer: Self-pay | Admitting: *Deleted

## 2014-11-25 MED ORDER — SERTRALINE HCL 100 MG PO TABS: 100.0000 mg | ORAL_TABLET | Freq: Every day | ORAL | Status: DC

## 2014-11-25 NOTE — Telephone Encounter (Signed)
Zoloft refilled for 6 months.  Pt's daughter notified.

## 2014-11-25 NOTE — Telephone Encounter (Signed)
Yes, ok for 6 months.

## 2014-11-25 NOTE — Telephone Encounter (Signed)
Pt's daughter left vm asking for a refill on her zoloft.  It's on the historical list.  Is this ok? Please advise.

## 2014-12-25 ENCOUNTER — Other Ambulatory Visit: Payer: Self-pay | Admitting: Physician Assistant

## 2014-12-25 MED ORDER — GEMFIBROZIL 600 MG PO TABS: 600.0000 mg | ORAL_TABLET | Freq: Two times a day (BID) | ORAL | Status: DC

## 2015-01-01 ENCOUNTER — Other Ambulatory Visit: Payer: Self-pay | Admitting: Physician Assistant

## 2015-01-01 ENCOUNTER — Other Ambulatory Visit: Payer: Self-pay | Admitting: Pulmonary Disease

## 2015-01-06 ENCOUNTER — Other Ambulatory Visit: Payer: Self-pay | Admitting: Physician Assistant

## 2015-02-17 ENCOUNTER — Telehealth: Payer: Self-pay

## 2015-02-17 MED ORDER — FLUCONAZOLE 150 MG PO TABS: 150.0000 mg | ORAL_TABLET | Freq: Every day | ORAL | Status: DC

## 2015-02-17 NOTE — Telephone Encounter (Signed)
Pts daughter Laurey Arrow called stating that her mother has a yeast infection caused by antibiotics that she is taking to prevent reoccurring UTIs. She wants a prescription for diflucan. Please advise.

## 2015-02-17 NOTE — Telephone Encounter (Signed)
OK, rx sent. Hold the simvastatin while taking the diflucan.  Needs appt if not better in 5 days.

## 2015-02-18 NOTE — Telephone Encounter (Signed)
Pt's daughter Gwinda Passe notified of recommendations.

## 2015-02-26 ENCOUNTER — Other Ambulatory Visit: Payer: Self-pay | Admitting: Physician Assistant

## 2015-02-26 MED ORDER — LEVOTHYROXINE SODIUM 100 MCG PO TABS: 100.0000 ug | ORAL_TABLET | Freq: Every day | ORAL | Status: DC

## 2015-02-26 NOTE — Telephone Encounter (Signed)
Left message with daughter to set up Pt with f/u appt.

## 2015-03-06 ENCOUNTER — Ambulatory Visit: Payer: Medicare Other | Admitting: Family Medicine

## 2015-03-31 ENCOUNTER — Other Ambulatory Visit: Payer: Self-pay | Admitting: Physician Assistant

## 2015-04-01 ENCOUNTER — Other Ambulatory Visit: Payer: Self-pay | Admitting: *Deleted

## 2015-04-01 MED ORDER — LEVOTHYROXINE SODIUM 100 MCG PO TABS: 100.0000 ug | ORAL_TABLET | Freq: Every day | ORAL | Status: DC

## 2015-05-21 ENCOUNTER — Other Ambulatory Visit: Payer: Self-pay | Admitting: Physician Assistant

## 2015-06-03 ENCOUNTER — Other Ambulatory Visit: Payer: Self-pay | Admitting: Physician Assistant

## 2015-06-13 ENCOUNTER — Other Ambulatory Visit: Payer: Self-pay | Admitting: Physician Assistant

## 2015-06-20 ENCOUNTER — Other Ambulatory Visit: Payer: Self-pay | Admitting: Physician Assistant

## 2015-07-03 ENCOUNTER — Other Ambulatory Visit: Payer: Self-pay | Admitting: Physician Assistant

## 2015-07-24 ENCOUNTER — Other Ambulatory Visit: Payer: Self-pay | Admitting: *Deleted

## 2015-07-24 MED ORDER — ALPRAZOLAM 0.25 MG PO TABS: 0.2500 mg | ORAL_TABLET | Freq: Every day | ORAL | Status: DC

## 2015-07-28 ENCOUNTER — Other Ambulatory Visit: Payer: Self-pay

## 2015-07-28 DIAGNOSIS — Z1231 Encounter for screening mammogram for malignant neoplasm of breast: Secondary | ICD-10-CM

## 2015-07-30 ENCOUNTER — Other Ambulatory Visit: Payer: Self-pay | Admitting: Physician Assistant

## 2015-08-25 ENCOUNTER — Other Ambulatory Visit: Payer: Self-pay | Admitting: Physician Assistant

## 2015-09-02 ENCOUNTER — Ambulatory Visit: Payer: Medicare Other

## 2015-10-01 ENCOUNTER — Ambulatory Visit
Admission: RE | Admit: 2015-10-01 | Discharge: 2015-10-01 | Disposition: A | Discharge status: Home / Self Care | Payer: Medicare Other | Source: Ambulatory Visit

## 2015-10-01 DIAGNOSIS — Z1231 Encounter for screening mammogram for malignant neoplasm of breast: Secondary | ICD-10-CM

## 2015-10-16 ENCOUNTER — Other Ambulatory Visit: Payer: Self-pay | Admitting: Physician Assistant

## 2015-11-03 ENCOUNTER — Other Ambulatory Visit: Payer: Self-pay | Admitting: Physician Assistant

## 2015-11-06 ENCOUNTER — Other Ambulatory Visit: Payer: Self-pay | Admitting: Physician Assistant

## 2015-11-16 ENCOUNTER — Other Ambulatory Visit: Payer: Self-pay | Admitting: Physician Assistant

## 2015-11-21 DIAGNOSIS — R5383 Other fatigue: Secondary | ICD-10-CM | POA: Diagnosis not present

## 2015-11-21 DIAGNOSIS — R52 Pain, unspecified: Secondary | ICD-10-CM | POA: Diagnosis not present

## 2015-12-07 ENCOUNTER — Other Ambulatory Visit: Payer: Self-pay | Admitting: Physician Assistant

## 2015-12-09 DIAGNOSIS — M25561 Pain in right knee: Secondary | ICD-10-CM | POA: Diagnosis not present

## 2015-12-09 DIAGNOSIS — M1712 Unilateral primary osteoarthritis, left knee: Secondary | ICD-10-CM | POA: Diagnosis not present

## 2015-12-09 DIAGNOSIS — M25562 Pain in left knee: Secondary | ICD-10-CM | POA: Diagnosis not present

## 2015-12-09 DIAGNOSIS — M1711 Unilateral primary osteoarthritis, right knee: Secondary | ICD-10-CM | POA: Diagnosis not present

## 2016-01-04 ENCOUNTER — Encounter: Payer: Self-pay | Admitting: Gastroenterology

## 2016-02-10 DIAGNOSIS — M1712 Unilateral primary osteoarthritis, left knee: Secondary | ICD-10-CM | POA: Diagnosis not present

## 2016-02-10 DIAGNOSIS — M1711 Unilateral primary osteoarthritis, right knee: Secondary | ICD-10-CM | POA: Diagnosis not present

## 2016-02-10 DIAGNOSIS — M25561 Pain in right knee: Secondary | ICD-10-CM | POA: Diagnosis not present

## 2016-02-10 DIAGNOSIS — M25562 Pain in left knee: Secondary | ICD-10-CM | POA: Diagnosis not present

## 2016-03-21 DIAGNOSIS — R350 Frequency of micturition: Secondary | ICD-10-CM | POA: Diagnosis not present

## 2016-03-21 DIAGNOSIS — F0391 Unspecified dementia with behavioral disturbance: Secondary | ICD-10-CM | POA: Diagnosis not present

## 2016-03-21 DIAGNOSIS — R410 Disorientation, unspecified: Secondary | ICD-10-CM | POA: Diagnosis not present

## 2016-03-21 DIAGNOSIS — E039 Hypothyroidism, unspecified: Secondary | ICD-10-CM | POA: Diagnosis not present

## 2016-06-21 DIAGNOSIS — Z23 Encounter for immunization: Secondary | ICD-10-CM | POA: Diagnosis not present

## 2016-07-06 DIAGNOSIS — M25561 Pain in right knee: Secondary | ICD-10-CM | POA: Diagnosis not present

## 2016-07-06 DIAGNOSIS — M1712 Unilateral primary osteoarthritis, left knee: Secondary | ICD-10-CM | POA: Diagnosis not present

## 2016-07-06 DIAGNOSIS — M1711 Unilateral primary osteoarthritis, right knee: Secondary | ICD-10-CM | POA: Diagnosis not present

## 2016-07-06 DIAGNOSIS — M25562 Pain in left knee: Secondary | ICD-10-CM | POA: Diagnosis not present

## 2016-08-09 ENCOUNTER — Other Ambulatory Visit: Payer: Self-pay | Admitting: Physician Assistant

## 2016-08-12 DIAGNOSIS — M1712 Unilateral primary osteoarthritis, left knee: Secondary | ICD-10-CM | POA: Diagnosis not present

## 2016-08-12 DIAGNOSIS — M25562 Pain in left knee: Secondary | ICD-10-CM | POA: Diagnosis not present

## 2016-08-12 DIAGNOSIS — M25561 Pain in right knee: Secondary | ICD-10-CM | POA: Diagnosis not present

## 2016-08-12 DIAGNOSIS — M1711 Unilateral primary osteoarthritis, right knee: Secondary | ICD-10-CM | POA: Diagnosis not present

## 2017-01-19 ENCOUNTER — Ambulatory Visit (INDEPENDENT_AMBULATORY_CARE_PROVIDER_SITE_OTHER): Payer: Medicare Other | Admitting: Physician Assistant

## 2017-01-19 ENCOUNTER — Other Ambulatory Visit: Payer: Self-pay | Admitting: Physician Assistant

## 2017-01-19 ENCOUNTER — Encounter: Payer: Self-pay | Admitting: Physician Assistant

## 2017-01-19 VITALS — BP 121/81 | HR 65

## 2017-01-19 DIAGNOSIS — E7800 Pure hypercholesterolemia, unspecified: Secondary | ICD-10-CM | POA: Diagnosis not present

## 2017-01-19 DIAGNOSIS — I10 Essential (primary) hypertension: Secondary | ICD-10-CM | POA: Diagnosis not present

## 2017-01-19 DIAGNOSIS — F330 Major depressive disorder, recurrent, mild: Secondary | ICD-10-CM | POA: Diagnosis not present

## 2017-01-19 DIAGNOSIS — N390 Urinary tract infection, site not specified: Secondary | ICD-10-CM | POA: Diagnosis not present

## 2017-01-19 DIAGNOSIS — E781 Pure hyperglyceridemia: Secondary | ICD-10-CM | POA: Diagnosis not present

## 2017-01-19 DIAGNOSIS — E038 Other specified hypothyroidism: Secondary | ICD-10-CM

## 2017-01-19 DIAGNOSIS — R519 Headache, unspecified: Secondary | ICD-10-CM

## 2017-01-19 DIAGNOSIS — R51 Headache: Secondary | ICD-10-CM | POA: Diagnosis not present

## 2017-01-19 DIAGNOSIS — R339 Retention of urine, unspecified: Secondary | ICD-10-CM | POA: Diagnosis not present

## 2017-01-19 DIAGNOSIS — F411 Generalized anxiety disorder: Secondary | ICD-10-CM

## 2017-01-19 LAB — POCT URINALYSIS DIPSTICK
Bilirubin, UA: NEGATIVE
Blood, UA: NEGATIVE
GLUCOSE UA: NEGATIVE
KETONES UA: NEGATIVE
LEUKOCYTES UA: NEGATIVE
Nitrite, UA: NEGATIVE
PROTEIN UA: NEGATIVE
SPEC GRAV UA: 1.015 (ref 1.010–1.025)
Urobilinogen, UA: 0.2 E.U./dL
pH, UA: 5.5 (ref 5.0–8.0)

## 2017-01-19 MED ORDER — BUTALBITAL-APAP-CAFFEINE 50-325-40 MG PO TABS: ORAL_TABLET | ORAL | 2 refills | Status: AC

## 2017-01-19 MED ORDER — SERTRALINE HCL 100 MG PO TABS: 100.0000 mg | ORAL_TABLET | Freq: Every day | ORAL | 5 refills | Status: DC

## 2017-01-19 MED ORDER — ALPRAZOLAM 0.25 MG PO TABS: 0.2500 mg | ORAL_TABLET | Freq: Every day | ORAL | 2 refills | Status: DC

## 2017-01-19 MED ORDER — ATENOLOL 50 MG PO TABS: 50.0000 mg | ORAL_TABLET | Freq: Every day | ORAL | 5 refills | Status: DC

## 2017-01-19 MED ORDER — TRIMETHOPRIM 100 MG PO TABS: ORAL_TABLET | ORAL | 5 refills | Status: DC

## 2017-01-19 MED ORDER — SIMVASTATIN 40 MG PO TABS: 40.0000 mg | ORAL_TABLET | Freq: Every day | ORAL | 5 refills | Status: DC

## 2017-01-19 NOTE — Progress Notes (Addendum)
Subjective:    Patient ID: Mary Alexander, female    DOB: Jul 10, 1927, 83 y.o.   MRN: [id number]  HPI  Pt is a 83 yo female who presents to the clinic with daughter and granddaughter. They were concerned because patient went 15 hours with barely urinating. She has hx of UTi's and on daily preventative. In lobby she urinated a basin full. Daughter admits she does not like to drink a lot. She is on ditropan for OAB. She denies problems with leaking and usually has more frequent urination. She denies any abdominal pain, flank pain, dysuria, or mental status changes.   She has not been seen in a while and patient requested medication refills. Pt feels like she is doing pretty good.     Review of Systems  All other systems reviewed and are negative.      Objective:   Physical Exam  Constitutional: She is oriented to person, place, and time. She appears well-developed and well-nourished.  In wheelchair today.   HENT:  Head: Normocephalic and atraumatic.  Neck: Normal range of motion. Neck supple.  Cardiovascular: Normal rate, regular rhythm and normal heart sounds.   Pulmonary/Chest: Effort normal and breath sounds normal. She has no wheezes.  No CVA tenderness.   Abdominal: Soft. Bowel sounds are normal. She exhibits no distension and no mass. There is no tenderness. There is no rebound and no guarding.  Lymphadenopathy:    She has no cervical adenopathy.  Neurological: She is alert and oriented to person, place, and time.  Psychiatric: She has a normal mood and affect. Her behavior is normal.          Assessment & Plan:  Marland KitchenMarland KitchenDiagnoses and all orders for this visit:  Urinary retention -     Urine Culture -     POCT urinalysis dipstick -     COMPLETE METABOLIC PANEL WITH GFR  Pure hypercholesterolemia -     simvastatin (ZOCOR) 40 MG tablet; Take 1 tablet (40 mg total) by mouth at bedtime. -     Lipid panel  Other specified hypothyroidism -     TSH  Essential  hypertension -     atenolol (TENORMIN) 50 MG tablet; Take 1 tablet (50 mg total) by mouth daily.  Mild episode of recurrent major depressive disorder (HCC) -     sertraline (ZOLOFT) 100 MG tablet; Take 1 tablet (100 mg total) by mouth daily.  Anxiety state -     ALPRAZolam (XANAX) 0.25 MG tablet; Take 1 tablet (0.25 mg total) by mouth at bedtime.  Recurrent UTI -     trimethoprim (TRIMPEX) 100 MG tablet; take 1 tablet by mouth once daily  Generalized headaches -     butalbital-acetaminophen-caffeine (FIORICET, ESGIC) 50-325-40 MG tablet; take 1 tablet by mouth every 6 hours if needed   After a full bladder evacuation does not appear to be any obstructive or non obstructive isses. Pt declines any worsening back pain or urinary dysfunction. She is having no symptoms. Urine dipstick is normal. Will culture to confirm. Encouraged patient to drink plenty of fluids and consider discontinue ditropan if this happens again.   Labs ordered today.   .. Depression screen [facility] 2/9 12/14/2012  Decreased Interest 0  Down, Depressed, Hopeless 0  PHQ - 2 Score 0   Refilled zoloft. Mood is doing well.  Pt uses ativan minimally but helps to give her when she gets aggitated or cannot sleep.   Follow up in 3 months.  BP looks great. Atenolol refilled.

## 2017-01-20 LAB — LIPID PANEL
CHOL/HDL RATIO: 9.7 ratio — AB (ref <5.0)
Cholesterol: 232 mg/dL — ABNORMAL HIGH (ref <200)
HDL: 24 mg/dL — AB (ref >50)
Triglycerides: 524 mg/dL — ABNORMAL HIGH (ref <150)

## 2017-01-20 LAB — COMPLETE METABOLIC PANEL WITH GFR
ALT: 11 U/L (ref 6–29)
AST: 16 U/L (ref 10–35)
Albumin: 3.7 g/dL (ref 3.6–5.1)
Alkaline Phosphatase: 48 U/L (ref 33–130)
BUN: 22 mg/dL (ref 7–25)
CO2: 26 mmol/L (ref 20–31)
Calcium: 9.2 mg/dL (ref 8.6–10.4)
Chloride: 104 mmol/L (ref 98–110)
Creat: 1.1 mg/dL — ABNORMAL HIGH (ref 0.60–0.88)
GFR, EST NON AFRICAN AMERICAN: 45 mL/min — AB (ref >=60)
GFR, Est African American: 52 mL/min — ABNORMAL LOW (ref >=60)
GLUCOSE: 107 mg/dL — AB (ref 65–99)
POTASSIUM: 4.1 mmol/L (ref 3.5–5.3)
SODIUM: 141 mmol/L (ref 135–146)
Total Bilirubin: 0.4 mg/dL (ref 0.2–1.2)
Total Protein: 5.7 g/dL — ABNORMAL LOW (ref 6.1–8.1)

## 2017-01-20 LAB — URINE CULTURE: ORGANISM ID, BACTERIA: NO GROWTH

## 2017-01-20 LAB — TSH: TSH: 8.44 m[IU]/L — AB

## 2017-01-21 ENCOUNTER — Encounter: Payer: Self-pay | Admitting: Physician Assistant

## 2017-01-21 DIAGNOSIS — N390 Urinary tract infection, site not specified: Secondary | ICD-10-CM | POA: Insufficient documentation

## 2017-01-23 ENCOUNTER — Encounter: Payer: Self-pay | Admitting: Physician Assistant

## 2017-01-23 DIAGNOSIS — E781 Pure hyperglyceridemia: Secondary | ICD-10-CM | POA: Insufficient documentation

## 2017-01-23 LAB — LDL CHOLESTEROL, DIRECT: Direct LDL: 101 mg/dL — ABNORMAL HIGH (ref <100)

## 2017-01-23 MED ORDER — LEVOTHYROXINE SODIUM 112 MCG PO TABS: 112.0000 ug | ORAL_TABLET | Freq: Every day | ORAL | 1 refills | Status: DC

## 2017-01-23 NOTE — Progress Notes (Signed)
Call pt:  TSH is elevated meaning more hypothyroid. Increased levothyroxine to 174mcg daily first thing in the mornings without food or other medications.  Kidneys look better.  No growth on urine culture.  Are you taking fish oil? Your triglycerides are very elevated and LDL could not be calculated. Can we order direct LDL?

## 2017-01-23 NOTE — Addendum Note (Signed)
Addended by: Donella Stade on: 01/23/2017 01:55 PM   Modules accepted: Orders

## 2017-01-30 ENCOUNTER — Encounter: Payer: Self-pay | Admitting: Physician Assistant

## 2017-01-30 DIAGNOSIS — F330 Major depressive disorder, recurrent, mild: Secondary | ICD-10-CM

## 2017-02-01 MED ORDER — DIAZEPAM 5 MG PO TABS: 5.0000 mg | ORAL_TABLET | Freq: Two times a day (BID) | ORAL | 2 refills | Status: DC | PRN

## 2017-02-01 MED ORDER — SERTRALINE HCL 100 MG PO TABS: ORAL_TABLET | ORAL | 2 refills | Status: DC

## 2017-03-02 ENCOUNTER — Encounter: Payer: Self-pay | Admitting: Physician Assistant

## 2017-03-03 NOTE — Telephone Encounter (Signed)
Oh lawd.

## 2017-03-12 ENCOUNTER — Other Ambulatory Visit: Payer: Self-pay | Admitting: Physician Assistant

## 2017-03-29 ENCOUNTER — Other Ambulatory Visit: Payer: Self-pay | Admitting: Family Medicine

## 2017-03-29 ENCOUNTER — Other Ambulatory Visit: Payer: Self-pay

## 2017-03-29 MED ORDER — DIAZEPAM 5 MG PO TABS: 5.0000 mg | ORAL_TABLET | Freq: Two times a day (BID) | ORAL | 0 refills | Status: DC | PRN

## 2017-05-02 ENCOUNTER — Telehealth: Payer: Self-pay

## 2017-05-02 DIAGNOSIS — F330 Major depressive disorder, recurrent, mild: Secondary | ICD-10-CM

## 2017-05-02 MED ORDER — SERTRALINE HCL 100 MG PO TABS: ORAL_TABLET | ORAL | 2 refills | Status: DC

## 2017-05-02 NOTE — Telephone Encounter (Signed)
Sent to pharmacy 

## 2017-05-02 NOTE — Telephone Encounter (Signed)
Called daughter and notified of script.

## 2017-05-02 NOTE — Telephone Encounter (Signed)
[name] called this morning and is requesting a refill on patient's zoloft.  Please advise.

## 2017-05-08 ENCOUNTER — Other Ambulatory Visit: Payer: Self-pay | Admitting: Physician Assistant

## 2017-05-24 ENCOUNTER — Other Ambulatory Visit: Payer: Self-pay | Admitting: Family Medicine

## 2017-05-29 ENCOUNTER — Other Ambulatory Visit: Payer: Self-pay | Admitting: Physician Assistant

## 2017-05-29 DIAGNOSIS — E7800 Pure hypercholesterolemia, unspecified: Secondary | ICD-10-CM

## 2017-06-06 ENCOUNTER — Other Ambulatory Visit: Payer: Self-pay | Admitting: Physician Assistant

## 2017-06-15 ENCOUNTER — Other Ambulatory Visit: Payer: Self-pay | Admitting: Family Medicine

## 2017-07-04 ENCOUNTER — Other Ambulatory Visit: Payer: Self-pay | Admitting: Physician Assistant

## 2017-07-19 ENCOUNTER — Other Ambulatory Visit: Payer: Self-pay | Admitting: Physician Assistant

## 2017-07-19 DIAGNOSIS — F330 Major depressive disorder, recurrent, mild: Secondary | ICD-10-CM

## 2017-08-01 ENCOUNTER — Other Ambulatory Visit: Payer: Self-pay | Admitting: Physician Assistant

## 2017-08-02 ENCOUNTER — Other Ambulatory Visit: Payer: Self-pay | Admitting: Physician Assistant

## 2017-08-07 ENCOUNTER — Encounter: Payer: Self-pay | Admitting: Physician Assistant

## 2017-08-07 ENCOUNTER — Other Ambulatory Visit: Payer: Self-pay | Admitting: Physician Assistant

## 2017-08-07 DIAGNOSIS — N390 Urinary tract infection, site not specified: Secondary | ICD-10-CM

## 2017-08-07 MED ORDER — LEVOTHYROXINE SODIUM 112 MCG PO TABS: 112.0000 ug | ORAL_TABLET | Freq: Every day | ORAL | 0 refills | Status: DC

## 2017-08-08 ENCOUNTER — Other Ambulatory Visit: Payer: Self-pay | Admitting: Pulmonary Disease

## 2017-08-08 MED ORDER — CILIDINIUM-CHLORDIAZEPOXIDE 2.5-5 MG PO CAPS: ORAL_CAPSULE | ORAL | 1 refills | Status: DC

## 2017-08-08 MED ORDER — LEVOTHYROXINE SODIUM 125 MCG PO TABS: 125.0000 ug | ORAL_TABLET | Freq: Every day | ORAL | 1 refills | Status: DC

## 2017-08-10 ENCOUNTER — Ambulatory Visit: Payer: Self-pay | Admitting: Physician Assistant

## 2017-08-11 ENCOUNTER — Other Ambulatory Visit: Payer: Self-pay | Admitting: *Deleted

## 2017-08-11 DIAGNOSIS — I10 Essential (primary) hypertension: Secondary | ICD-10-CM

## 2017-08-11 MED ORDER — ATENOLOL 50 MG PO TABS: 50.0000 mg | ORAL_TABLET | Freq: Every day | ORAL | 1 refills | Status: DC

## 2017-08-15 ENCOUNTER — Other Ambulatory Visit: Payer: Self-pay | Admitting: *Deleted

## 2017-08-15 DIAGNOSIS — I10 Essential (primary) hypertension: Secondary | ICD-10-CM

## 2017-08-15 MED ORDER — ATENOLOL 50 MG PO TABS: 50.0000 mg | ORAL_TABLET | Freq: Every day | ORAL | 0 refills | Status: DC

## 2017-08-17 ENCOUNTER — Encounter: Payer: Self-pay | Admitting: Physician Assistant

## 2017-08-18 MED ORDER — CILIDINIUM-CHLORDIAZEPOXIDE 2.5-5 MG PO CAPS: ORAL_CAPSULE | ORAL | 0 refills | Status: DC

## 2017-08-22 ENCOUNTER — Other Ambulatory Visit: Payer: Self-pay | Admitting: Physician Assistant

## 2017-08-22 DIAGNOSIS — F330 Major depressive disorder, recurrent, mild: Secondary | ICD-10-CM

## 2017-08-31 ENCOUNTER — Other Ambulatory Visit: Payer: Self-pay | Admitting: Physician Assistant

## 2017-09-13 ENCOUNTER — Encounter: Payer: Self-pay | Admitting: Physician Assistant

## 2017-09-13 ENCOUNTER — Ambulatory Visit (INDEPENDENT_AMBULATORY_CARE_PROVIDER_SITE_OTHER): Payer: Medicare Other | Admitting: Physician Assistant

## 2017-09-13 VITALS — BP 129/61 | HR 60 | Temp 97.5°F

## 2017-09-13 DIAGNOSIS — R319 Hematuria, unspecified: Secondary | ICD-10-CM

## 2017-09-13 DIAGNOSIS — Z131 Encounter for screening for diabetes mellitus: Secondary | ICD-10-CM | POA: Diagnosis not present

## 2017-09-13 DIAGNOSIS — N3001 Acute cystitis with hematuria: Secondary | ICD-10-CM | POA: Diagnosis not present

## 2017-09-13 DIAGNOSIS — R252 Cramp and spasm: Secondary | ICD-10-CM | POA: Diagnosis not present

## 2017-09-13 DIAGNOSIS — E785 Hyperlipidemia, unspecified: Secondary | ICD-10-CM

## 2017-09-13 DIAGNOSIS — R8290 Unspecified abnormal findings in urine: Secondary | ICD-10-CM

## 2017-09-13 DIAGNOSIS — R630 Anorexia: Secondary | ICD-10-CM | POA: Diagnosis not present

## 2017-09-13 DIAGNOSIS — E039 Hypothyroidism, unspecified: Secondary | ICD-10-CM | POA: Diagnosis not present

## 2017-09-13 DIAGNOSIS — R0789 Other chest pain: Secondary | ICD-10-CM | POA: Diagnosis not present

## 2017-09-13 DIAGNOSIS — R410 Disorientation, unspecified: Secondary | ICD-10-CM | POA: Diagnosis not present

## 2017-09-13 LAB — POCT URINALYSIS DIPSTICK
Bilirubin, UA: NEGATIVE
Glucose, UA: NEGATIVE
Ketones, UA: NEGATIVE
NITRITE UA: NEGATIVE
Protein, UA: 100
Urobilinogen, UA: 0.2 E.U./dL
pH, UA: 5.5 (ref 5.0–8.0)

## 2017-09-13 MED ORDER — MEGESTROL ACETATE 400 MG/10ML PO SUSP: 400.0000 mg | Freq: Every day | ORAL | 5 refills | Status: AC

## 2017-09-13 MED ORDER — CEFTRIAXONE SODIUM 1 G IJ SOLR
1.0000 g | Freq: Once | INTRAMUSCULAR | Status: AC
  Administered 2017-09-13: 1 g via INTRAMUSCULAR

## 2017-09-13 MED ORDER — NITROFURANTOIN MONOHYD MACRO 100 MG PO CAPS: 100.0000 mg | ORAL_CAPSULE | Freq: Two times a day (BID) | ORAL | 0 refills | Status: DC

## 2017-09-13 NOTE — Progress Notes (Signed)
Call pt: WBC good. Reassuring.

## 2017-09-13 NOTE — Progress Notes (Signed)
Subjective:    Patient ID: Mary Alexander, female    DOB: Mar 02, 1927, 84 y.o.   MRN: [id number]  HPI  Patient is a 84 year old female with HTN, GERD, hx of stroke, hypothyroidism, dyslipidemia who presents to the clinic with her daughter and 2 granddaughters they concerned she could have a UTI due to smell to her urine and mental confusion and lethargy.   For the past 2 weeks she has seem to be much more lethargic, increased urination, smell to urine, urine darker than normal and confused. No fever, chills, or c/o lower abdominal or flank pain.   They would also like refills of her current medications as it is hard to get her in the office.   Hypothyroidism- taking medication daily. TSH previously elevated not checked in 3 months.   Her family has concernes with some nightime cramping in feet. Seem to resolve with time and massage.   Pt is not eating a lot. She has no appetitie. Most days family struggles to get 800 calories in her. She is also not drinking adequately.   Pt is having episodes of chest pain. Seems to be related to indigestion. Usually clears with tums and time. Burping does help. Caregiver would like some NTG on hand to use if persistent.    .. Active Ambulatory Problems    Diagnosis Date Noted  . COLONIC POLYPS 10/29/2007  . HYPOTHYROIDISM 10/29/2007  . HYPERCHOLESTEROLEMIA 10/29/2007  . Anxiety state 10/29/2007  . Essential hypertension 10/29/2007  . HEMORRHOIDS-EXTERNAL 05/01/2009  . SINUSITIS 10/29/2007  . ALLERGIC RHINITIS 10/29/2007  . GERD 10/29/2007  . IRRITABLE BOWEL SYNDROME 08/26/2009  . RECTAL BLEEDING 05/01/2009  . HEMATOCHEZIA 05/28/2009  . DEGENERATIVE JOINT DISEASE 10/29/2007  . BACK PAIN, LUMBAR 10/29/2007  . SKIN RASH 11/29/2009  . PALPITATIONS 04/29/2008  . CHEST PAIN, ATYPICAL 07/03/2008  . DIABETES MELLITUS, BORDERLINE 08/13/2008  . PERSONAL HX COLONIC POLYPS 05/28/2009  . Cerebrovascular disease 08/25/2011  . Stroke ([place]) 07/23/2012   . UTI (urinary tract infection) 05/03/2013  . Kidney stones 02/21/2014  . Hyperlipidemia 02/21/2014  . Depression 02/21/2014  . Hypothyroidism 02/21/2014  . GERD (gastroesophageal reflux disease) 02/21/2014  . Seborrheic keratoses 11/03/2014  . Dehydration 11/10/2014  . OAB (overactive bladder) 11/24/2014  . Dementia 11/24/2014  . Recurrent UTI 01/21/2017  . Hypertriglyceridemia 01/23/2017  . No appetite 09/13/2017  . Dyslipidemia 09/13/2017  . Nocturnal foot cramps 09/13/2017   Resolved Ambulatory Problems    Diagnosis Date Noted  . Lung crackles 10/29/2007  . HEADACHE, MIXED 10/30/2007   Past Medical History:  Diagnosis Date  . Acute bronchitis   . Allergic rhinitis, cause unspecified   . Anxiety state, unspecified   . Benign neoplasm of colon   . Esophageal reflux   . External hemorrhoids without mention of complication   . Headache(784.0)   . Irritable bowel syndrome   . Lumbago   . Osteoarthrosis, unspecified whether generalized or localized, unspecified site   . Other chest pain   . Palpitations   . Pure hypercholesterolemia   . Unspecified hypothyroidism   . Unspecified sinusitis (chronic)       Review of Systems See HPI.     Objective:   Physical Exam  Constitutional: She is oriented to person, place, and time. She appears well-developed and well-nourished.  In a wheelchair. Quiet. Thin/frail  HENT:  Head: Normocephalic and atraumatic.  Neck: Normal range of motion. Neck supple.  Cardiovascular: Normal rate, regular rhythm and normal heart sounds.  Pulmonary/Chest: Effort  normal and breath sounds normal.  No CVA tenderness.   Abdominal: Soft. Bowel sounds are normal. She exhibits no distension and no mass. There is no tenderness. There is no rebound and no guarding.  Lymphadenopathy:    She has no cervical adenopathy.  Neurological: She is alert and oriented to person, place, and time.  Psychiatric: She has a normal mood and affect. Her behavior is  normal.          Assessment & Plan:  Marland KitchenMarland KitchenDiagnoses and all orders for this visit:  Acute cystitis with hematuria -     nitrofurantoin, macrocrystal-monohydrate, (MACROBID) 100 MG capsule; Take 1 capsule (100 mg total) by mouth 2 (two) times daily. For 7 days for UTI. -     cefTRIAXone (ROCEPHIN) injection 1 g  Abnormal urine odor -     POCT urinalysis dipstick -     COMPLETE METABOLIC PANEL WITH GFR -     CBC with Differential/Platelet  Confusion -     POCT urinalysis dipstick -     COMPLETE METABOLIC PANEL WITH GFR -     CBC with Differential/Platelet  Hematuria, unspecified type -     Urine Culture -     CBC with Differential/Platelet  Acquired hypothyroidism -     TSH  Nocturnal foot cramps -     COMPLETE METABOLIC PANEL WITH GFR -     Magnesium  Screening for diabetes mellitus -     COMPLETE METABOLIC PANEL WITH GFR -     Hemoglobin A1C w/out eAG  Dyslipidemia -     Lipid Panel w/reflex Direct LDL  No appetite -     megestrol (MEGACE) 400 MG/10ML suspension; Take 10 mLs (400 mg total) by mouth daily.  Atypical chest pain -     nitroGLYCERIN (NITROSTAT) 0.4 MG SL tablet; Place 1 tablet (0.4 mg total) under the tongue every 5 (five) minutes as needed for chest pain.   Will recheck TSH and adjust medication accordingly.   Fasting labs ordered.   UA dipstick positive for blood, leuks, protein.  Treated with macrobid.  Rocephin 1 gm IM given in office today to get started.  Culture ordered.  STAY hydrated.   nitroglyerin given but NOT to try before tums. Discussed risk of NTG such as drop in BP.   Follow up in 3 months.

## 2017-09-13 NOTE — Patient Instructions (Signed)
Leg Cramps Leg cramps occur when a muscle or muscles tighten and you have no control over this tightening (involuntary muscle contraction). Muscle cramps can develop in any muscle, but the most common place is in the calf muscles of the leg. Those cramps can occur during exercise or when you are at rest. Leg cramps are painful, and they may last for a few seconds to a few minutes. Cramps may return several times before they finally stop. Usually, leg cramps are not caused by a serious medical problem. In many cases, the cause is not known. Some common causes include:  Overexertion.  Overuse from repetitive motions, or doing the same thing over and over.  Remaining in a certain position for a long period of time.  Improper preparation, form, or technique while performing a sport or an activity.  Dehydration.  Injury.  Side effects of some medicines.  Abnormally low levels of the salts and ions in your blood (electrolytes), especially potassium and calcium. These levels could be low if you are taking water pills (diuretics) or if you are pregnant.  Follow these instructions at home: Watch your condition for any changes. Taking the following actions may help to lessen any discomfort that you are feeling:  Stay well-hydrated. Drink enough fluid to keep your urine clear or pale yellow.  Try massaging, stretching, and relaxing the affected muscle. Do this for several minutes at a time.  For tight or tense muscles, use a warm towel, heating pad, or hot shower water directed to the affected area.  If you are sore or have pain after a cramp, applying ice to the affected area may relieve discomfort. ? Put ice in a plastic bag. ? Place a towel between your skin and the bag. ? Leave the ice on for 20 minutes, 2-3 times per day.  Avoid strenuous exercise for several days if you have been having frequent leg cramps.  Make sure that your diet includes the essential minerals for your muscles to  work normally.  Take medicines only as directed by your health care provider.  Contact a health care provider if:  Your leg cramps get more severe or more frequent, or they do not improve over time.  Your foot becomes cold, numb, or blue. This information is not intended to replace advice given to you by your health care provider. Make sure you discuss any questions you have with your health care provider. Document Released: 10/06/2004 Document Revised: 02/04/2016 Document Reviewed: 08/06/2014 Elsevier Interactive Patient Education  2018 [employer]. Urinary Tract Infection, Adult A urinary tract infection (UTI) is an infection of any part of the urinary tract. The urinary tract includes the:  Kidneys.  Ureters.  Bladder.  Urethra.  These organs make, store, and get rid of pee (urine) in the body. Follow these instructions at home:  Take over-the-counter and prescription medicines only as told by your doctor.  If you were prescribed an antibiotic medicine, take it as told by your doctor. Do not stop taking the antibiotic even if you start to feel better.  Avoid the following drinks: ? Alcohol. ? Caffeine. ? Tea. ? Carbonated drinks.  Drink enough fluid to keep your pee clear or pale yellow.  Keep all follow-up visits as told by your doctor. This is important.  Make sure to: ? Empty your bladder often and completely. Do not to hold pee for long periods of time. ? Empty your bladder before and after sex. ? Wipe from front to back after  a bowel movement if you are female. Use each tissue one time when you wipe. Contact a doctor if:  You have back pain.  You have a fever.  You feel sick to your stomach (nauseous).  You throw up (vomit).  Your symptoms do not get better after 3 days.  Your symptoms go away and then come back. Get help right away if:  You have very bad back pain.  You have very bad lower belly (abdominal) pain.  You are throwing up and  cannot keep down any medicines or water. This information is not intended to replace advice given to you by your health care provider. Make sure you discuss any questions you have with your health care provider. Document Released: 02/15/2008 Document Revised: 02/04/2016 Document Reviewed: 07/20/2015 Elsevier Interactive Patient Education  [employer].

## 2017-09-14 ENCOUNTER — Other Ambulatory Visit: Payer: Self-pay | Admitting: Physician Assistant

## 2017-09-15 ENCOUNTER — Other Ambulatory Visit: Payer: Self-pay | Admitting: Physician Assistant

## 2017-09-15 LAB — URINE CULTURE
MICRO NUMBER: 90003811
SPECIMEN QUALITY:: ADEQUATE

## 2017-09-15 MED ORDER — CEPHALEXIN 500 MG PO CAPS: 500.0000 mg | ORAL_CAPSULE | Freq: Two times a day (BID) | ORAL | 0 refills | Status: DC

## 2017-09-15 MED ORDER — LEVOTHYROXINE SODIUM 137 MCG PO TABS: 137.0000 ug | ORAL_TABLET | Freq: Every day | ORAL | 1 refills | Status: DC

## 2017-09-15 MED ORDER — NITROGLYCERIN 0.4 MG SL SUBL: 0.4000 mg | SUBLINGUAL_TABLET | SUBLINGUAL | 0 refills | Status: AC | PRN

## 2017-09-15 NOTE — Progress Notes (Signed)
Keflex should work since the cephalosporin family is sensitive. You do have a lot of resistance noted.

## 2017-09-16 LAB — COMPLETE METABOLIC PANEL WITH GFR
AG Ratio: 1.5 (calc) (ref 1.0–2.5)
ALT: 16 U/L (ref 6–29)
AST: 29 U/L (ref 10–35)
Albumin: 3.6 g/dL (ref 3.6–5.1)
Alkaline phosphatase (APISO): 59 U/L (ref 33–130)
BUN/Creatinine Ratio: 15 (calc) (ref 6–22)
BUN: 22 mg/dL (ref 7–25)
CO2: 29 mmol/L (ref 20–32)
Calcium: 9.1 mg/dL (ref 8.6–10.4)
Chloride: 104 mmol/L (ref 98–110)
Creat: 1.46 mg/dL — ABNORMAL HIGH (ref 0.60–0.88)
GFR, EST AFRICAN AMERICAN: 37 mL/min/{1.73_m2} — AB (ref >=60)
GFR, Est Non African American: 32 mL/min/{1.73_m2} — ABNORMAL LOW (ref >=60)
GLUCOSE: 135 mg/dL — AB (ref 65–99)
Globulin: 2.4 g/dL (calc) (ref 1.9–3.7)
Potassium: 4.5 mmol/L (ref 3.5–5.3)
Sodium: 141 mmol/L (ref 135–146)
TOTAL PROTEIN: 6 g/dL — AB (ref 6.1–8.1)
Total Bilirubin: 0.6 mg/dL (ref 0.2–1.2)

## 2017-09-16 LAB — LIPID PANEL W/REFLEX DIRECT LDL
CHOLESTEROL: 190 mg/dL (ref <200)
HDL: 30 mg/dL — AB (ref >50)
LDL CHOLESTEROL (CALC): 118 mg/dL — AB
Non-HDL Cholesterol (Calc): 160 mg/dL (calc) — ABNORMAL HIGH (ref <130)
TRIGLYCERIDES: 293 mg/dL — AB (ref <150)
Total CHOL/HDL Ratio: 6.3 (calc) — ABNORMAL HIGH (ref <5.0)

## 2017-09-16 LAB — CBC WITH DIFFERENTIAL/PLATELET
Basophils Absolute: 102 cells/uL (ref 0–200)
Basophils Relative: 1.2 %
Eosinophils Absolute: 196 cells/uL (ref 15–500)
Eosinophils Relative: 2.3 %
HCT: 35.5 % (ref 35.0–45.0)
Hemoglobin: 11.8 g/dL (ref 11.7–15.5)
Lymphs Abs: 1819 cells/uL (ref 850–3900)
MCH: 28.9 pg (ref 27.0–33.0)
MCHC: 33.2 g/dL (ref 32.0–36.0)
MCV: 86.8 fL (ref 80.0–100.0)
MONOS PCT: 8.8 %
MPV: 10.5 fL (ref 7.5–12.5)
NEUTROS PCT: 66.3 %
Neutro Abs: 5636 cells/uL (ref 1500–7800)
PLATELETS: 241 10*3/uL (ref 140–400)
RBC: 4.09 10*6/uL (ref 3.80–5.10)
RDW: 14.4 % (ref 11.0–15.0)
TOTAL LYMPHOCYTE: 21.4 %
WBC mixed population: 748 cells/uL (ref 200–950)
WBC: 8.5 10*3/uL (ref 3.8–10.8)

## 2017-09-16 LAB — TSH: TSH: 7.56 m[IU]/L — AB (ref 0.40–4.50)

## 2017-09-16 LAB — HEMOGLOBIN A1C W/OUT EAG: Hgb A1c MFr Bld: 6.1 % of total Hgb — ABNORMAL HIGH (ref <5.7)

## 2017-09-16 LAB — MAGNESIUM: Magnesium: 2.4 mg/dL (ref 1.5–2.5)

## 2017-09-19 ENCOUNTER — Ambulatory Visit: Payer: Self-pay | Admitting: Physician Assistant

## 2017-09-19 ENCOUNTER — Other Ambulatory Visit: Payer: Self-pay | Admitting: *Deleted

## 2017-09-19 DIAGNOSIS — F330 Major depressive disorder, recurrent, mild: Secondary | ICD-10-CM

## 2017-09-19 MED ORDER — SERTRALINE HCL 100 MG PO TABS: ORAL_TABLET | ORAL | 0 refills | Status: DC

## 2017-09-20 ENCOUNTER — Other Ambulatory Visit: Payer: Self-pay | Admitting: Physician Assistant

## 2017-09-20 ENCOUNTER — Ambulatory Visit (INDEPENDENT_AMBULATORY_CARE_PROVIDER_SITE_OTHER): Payer: Medicare Other | Admitting: Physician Assistant

## 2017-09-20 ENCOUNTER — Ambulatory Visit (INDEPENDENT_AMBULATORY_CARE_PROVIDER_SITE_OTHER): Payer: Medicare Other

## 2017-09-20 ENCOUNTER — Encounter: Payer: Self-pay | Admitting: Physician Assistant

## 2017-09-20 VITALS — BP 132/43 | HR 67 | Temp 98.3°F

## 2017-09-20 DIAGNOSIS — R0989 Other specified symptoms and signs involving the circulatory and respiratory systems: Secondary | ICD-10-CM | POA: Diagnosis not present

## 2017-09-20 DIAGNOSIS — N3001 Acute cystitis with hematuria: Secondary | ICD-10-CM

## 2017-09-20 DIAGNOSIS — R319 Hematuria, unspecified: Secondary | ICD-10-CM | POA: Diagnosis not present

## 2017-09-20 DIAGNOSIS — R509 Fever, unspecified: Secondary | ICD-10-CM

## 2017-09-20 DIAGNOSIS — R531 Weakness: Secondary | ICD-10-CM | POA: Diagnosis not present

## 2017-09-20 DIAGNOSIS — R109 Unspecified abdominal pain: Secondary | ICD-10-CM | POA: Insufficient documentation

## 2017-09-20 DIAGNOSIS — R05 Cough: Secondary | ICD-10-CM

## 2017-09-20 DIAGNOSIS — R059 Cough, unspecified: Secondary | ICD-10-CM | POA: Insufficient documentation

## 2017-09-20 DIAGNOSIS — E440 Moderate protein-calorie malnutrition: Secondary | ICD-10-CM | POA: Diagnosis not present

## 2017-09-20 MED ORDER — AZITHROMYCIN 250 MG PO TABS: ORAL_TABLET | ORAL | 0 refills | Status: DC

## 2017-09-20 MED ORDER — CEFTRIAXONE SODIUM 1 G IJ SOLR
1.0000 g | Freq: Once | INTRAMUSCULAR | Status: AC
  Administered 2017-09-20: 1 g via INTRAMUSCULAR

## 2017-09-20 NOTE — Progress Notes (Signed)
Subjective:    Patient ID: Mary Alexander, female    DOB: 01-14-1927, 84 y.o.   MRN: [id number]  HPI  Pt is a 84 yo female with HTN, CAD, hypothyroidism, recurrent UTI's who presents to the clinic to follow up after dx of UTI on 09/13/17. Urine culture confirmed infection. She was orginally started on macrobid but pt did not tolerate it well and switched to keflex. She is accompanied by her daughter and grandaughters who do all the talking for her. She is tolerating keflex but most concern with her weakness, new cough, high fevers. Last night fever was 102 and the patient was burning up. She is very weak and cannot help them at all support her body weight. She is not wanting to eat or drink. She has some paranoid ideas usually at night. She choked on a cracker last night. Some concern for aspiration.   Family does not wish to take her to ER. They are interested in some help.   .. Active Ambulatory Problems    Diagnosis Date Noted  . COLONIC POLYPS 10/29/2007  . HYPOTHYROIDISM 10/29/2007  . HYPERCHOLESTEROLEMIA 10/29/2007  . Anxiety state 10/29/2007  . Essential hypertension 10/29/2007  . HEMORRHOIDS-EXTERNAL 05/01/2009  . SINUSITIS 10/29/2007  . ALLERGIC RHINITIS 10/29/2007  . GERD 10/29/2007  . IRRITABLE BOWEL SYNDROME 08/26/2009  . RECTAL BLEEDING 05/01/2009  . HEMATOCHEZIA 05/28/2009  . DEGENERATIVE JOINT DISEASE 10/29/2007  . BACK PAIN, LUMBAR 10/29/2007  . SKIN RASH 11/29/2009  . PALPITATIONS 04/29/2008  . CHEST PAIN, ATYPICAL 07/03/2008  . DIABETES MELLITUS, BORDERLINE 08/13/2008  . PERSONAL HX COLONIC POLYPS 05/28/2009  . Cerebrovascular disease 08/25/2011  . Stroke ([place]) 07/23/2012  . UTI (urinary tract infection) 05/03/2013  . Kidney stones 02/21/2014  . Hyperlipidemia 02/21/2014  . Depression 02/21/2014  . Hypothyroidism 02/21/2014  . GERD (gastroesophageal reflux disease) 02/21/2014  . Seborrheic keratoses 11/03/2014  . Dehydration 11/10/2014  . OAB (overactive  bladder) 11/24/2014  . Dementia 11/24/2014  . Recurrent UTI 01/21/2017  . Hypertriglyceridemia 01/23/2017  . No appetite 09/13/2017  . Dyslipidemia 09/13/2017  . Nocturnal foot cramps 09/13/2017  . Fever of unknown origin 09/20/2017  . Flank pain 09/20/2017  . Cough 09/20/2017   Resolved Ambulatory Problems    Diagnosis Date Noted  . Lung crackles 10/29/2007  . HEADACHE, MIXED 10/30/2007   Past Medical History:  Diagnosis Date  . Acute bronchitis   . Allergic rhinitis, cause unspecified   . Anxiety state, unspecified   . Benign neoplasm of colon   . Esophageal reflux   . External hemorrhoids without mention of complication   . Headache(784.0)   . Irritable bowel syndrome   . Lumbago   . Osteoarthrosis, unspecified whether generalized or localized, unspecified site   . Other chest pain   . Palpitations   . Pure hypercholesterolemia   . Unspecified hypothyroidism   . Unspecified sinusitis (chronic)       Review of Systems See HPI.     Objective:   Physical Exam  Constitutional: She is oriented to person, place, and time. She appears well-developed and well-nourished.  Weak falling asleep in the wheelchair.   HENT:  Head: Normocephalic and atraumatic.  Cardiovascular: Normal rate, regular rhythm and normal heart sounds.  Pulmonary/Chest:  Diffuse crackles of both lungs. No wheezing or rhonchi.   Neurological: She is alert and oriented to person, place, and time.  Psychiatric: She has a normal mood and affect. Her behavior is normal.  Assessment & Plan:  Marland KitchenMarland KitchenDiagnoses and all orders for this visit:  Malnutrition of moderate degree (HCC) -     Amb Referral to Palliative Care  Acute cystitis with hematuria -     Blood culture (routine single) -     DG Abd 1 View -     cefTRIAXone (ROCEPHIN) injection 1 g  Cough -     DG Chest 2 View  Respiratory crackles of both lungs -     azithromycin (ZITHROMAX) 250 MG tablet; Take 2 tablets now and then one  tablet for 4 days. -     DG Chest 2 View  Fever of unknown origin -     azithromycin (ZITHROMAX) 250 MG tablet; Take 2 tablets now and then one tablet for 4 days. -     Blood culture (routine single) -     cefTRIAXone (ROCEPHIN) injection 1 g -     CULTURE BLOOD MANUAL   Flank pain -     DG Abd 1 View  Weakness -     Amb Referral to Palliative Care   Pt certainly has UTI. On keflex. Concern infection is not improving like it should.  Rocephin IM given in office today.  Pt does have some crackles. Pneumonia is concerned. zpak sent if CXR shows pneumonia. If negative no need to take.  CXR ordered.  Blood cultures to be drawn today.  Concerned she is not eating. Continue to offer her food and drinks.  Referral to palliative care.   Marland Kitchen.Spent 30 minutes with patient and greater than 50 percent of visit spent counseling patient regarding treatment plan.

## 2017-09-20 NOTE — Patient Instructions (Signed)
Will get blood cultures.  Will get CXR/KUB.   Start zpak with keflex.  Will make referral for pallative care.

## 2017-09-20 NOTE — Progress Notes (Signed)
No kidney stones seen

## 2017-09-22 NOTE — Progress Notes (Signed)
Call pt: how is she feeling today? Any improvement in weakness?  Blood culture show no growth to date. Will continue to monitor.

## 2017-09-25 ENCOUNTER — Telehealth: Payer: Self-pay | Admitting: *Deleted

## 2017-09-25 NOTE — Telephone Encounter (Signed)
This request has been approved. [employer] will send a letter to the member and you regarding this decision. Please see additional information at the bottom of this request  Outcome  Approvedon January 9  Effective from 09/20/2017 through 09/20/2018

## 2017-09-26 ENCOUNTER — Encounter: Payer: Self-pay | Admitting: Physician Assistant

## 2017-09-28 LAB — CULTURE BLOOD MANUAL
MICRO NUMBER 7002: 90040205
RESULT 123: NO GROWTH
SPECIMEN QUALITY 12: ADEQUATE

## 2017-10-02 ENCOUNTER — Other Ambulatory Visit: Payer: Self-pay | Admitting: Physician Assistant

## 2017-10-04 ENCOUNTER — Encounter: Payer: Self-pay | Admitting: Physician Assistant

## 2017-10-05 ENCOUNTER — Other Ambulatory Visit: Payer: Self-pay | Admitting: *Deleted

## 2017-11-02 ENCOUNTER — Other Ambulatory Visit: Payer: Self-pay | Admitting: Physician Assistant

## 2017-11-21 ENCOUNTER — Other Ambulatory Visit: Payer: Self-pay | Admitting: Physician Assistant

## 2017-11-21 NOTE — Telephone Encounter (Signed)
It appears pt is taking med TID instead of TID PRN. OK to RF?

## 2017-11-27 ENCOUNTER — Other Ambulatory Visit: Payer: Self-pay | Admitting: Physician Assistant

## 2017-12-12 ENCOUNTER — Other Ambulatory Visit: Payer: Self-pay | Admitting: Physician Assistant

## 2017-12-12 DIAGNOSIS — F330 Major depressive disorder, recurrent, mild: Secondary | ICD-10-CM

## 2017-12-14 ENCOUNTER — Other Ambulatory Visit: Payer: Self-pay | Admitting: Physician Assistant

## 2017-12-21 ENCOUNTER — Encounter: Payer: Self-pay | Admitting: Physician Assistant

## 2017-12-22 ENCOUNTER — Other Ambulatory Visit: Payer: Self-pay

## 2017-12-24 ENCOUNTER — Other Ambulatory Visit: Payer: Self-pay | Admitting: Physician Assistant

## 2017-12-24 DIAGNOSIS — N390 Urinary tract infection, site not specified: Secondary | ICD-10-CM

## 2017-12-25 MED ORDER — LEVOTHYROXINE SODIUM 137 MCG PO TABS: 137.0000 ug | ORAL_TABLET | Freq: Every day | ORAL | 2 refills | Status: DC

## 2017-12-25 NOTE — Progress Notes (Signed)
Its the 155mcg.

## 2017-12-25 NOTE — Telephone Encounter (Signed)
Pt has not been called for hospice or any palliative care assessment. Can we look into this? Maybe the same referral as robert jonas.

## 2017-12-25 NOTE — Progress Notes (Signed)
Rx sent 

## 2018-01-03 ENCOUNTER — Encounter: Payer: Self-pay | Admitting: Physician Assistant

## 2018-01-04 ENCOUNTER — Other Ambulatory Visit: Payer: Self-pay | Admitting: Physician Assistant

## 2018-01-04 ENCOUNTER — Telehealth: Payer: Self-pay | Admitting: Family Medicine

## 2018-01-04 NOTE — Telephone Encounter (Signed)
Please find out if Hospice reached out to hr or not.  It was ordered in January but duaghter said hadn't heard anything a few weeks ago. Not sure if this was taken care of or not.    Beatrice Lecher, MD

## 2018-01-05 NOTE — Telephone Encounter (Signed)
I called and left a message for patient's daughter to call me back, I will see if Hospice has been in touch with them and if not I will take care of referral as soon as I hear from her daughter - CF

## 2018-01-09 ENCOUNTER — Other Ambulatory Visit: Payer: Self-pay | Admitting: *Deleted

## 2018-01-09 MED ORDER — AMBULATORY NON FORMULARY MEDICATION: 0 refills | Status: AC

## 2018-01-10 DIAGNOSIS — R109 Unspecified abdominal pain: Secondary | ICD-10-CM | POA: Diagnosis not present

## 2018-01-10 DIAGNOSIS — R509 Fever, unspecified: Secondary | ICD-10-CM | POA: Diagnosis not present

## 2018-01-10 DIAGNOSIS — R252 Cramp and spasm: Secondary | ICD-10-CM | POA: Diagnosis not present

## 2018-01-10 DIAGNOSIS — F0390 Unspecified dementia without behavioral disturbance: Secondary | ICD-10-CM | POA: Diagnosis not present

## 2018-01-14 ENCOUNTER — Other Ambulatory Visit: Payer: Self-pay | Admitting: Physician Assistant

## 2018-01-19 ENCOUNTER — Encounter: Payer: Self-pay | Admitting: Physician Assistant

## 2018-01-21 ENCOUNTER — Encounter: Payer: Self-pay | Admitting: Physician Assistant

## 2018-01-23 ENCOUNTER — Encounter: Payer: Self-pay | Admitting: Physician Assistant

## 2018-01-23 DIAGNOSIS — R4182 Altered mental status, unspecified: Secondary | ICD-10-CM

## 2018-01-23 DIAGNOSIS — R8290 Unspecified abnormal findings in urine: Secondary | ICD-10-CM

## 2018-01-24 ENCOUNTER — Other Ambulatory Visit: Payer: Self-pay | Admitting: Physician Assistant

## 2018-01-24 DIAGNOSIS — R627 Adult failure to thrive: Secondary | ICD-10-CM

## 2018-01-24 DIAGNOSIS — R531 Weakness: Secondary | ICD-10-CM

## 2018-01-24 DIAGNOSIS — L8940 Pressure ulcer of contiguous site of back, buttock and hip, unspecified stage: Secondary | ICD-10-CM

## 2018-01-24 NOTE — Progress Notes (Signed)
am

## 2018-01-24 NOTE — Telephone Encounter (Signed)
No bacteria found in urine. Pending culture.  We faxed daughters FMLA forms today. Can come by and pick up hard copy for your records.  I will send referral for palliative care. Please let office know if not contacted in next few days.

## 2018-01-25 ENCOUNTER — Other Ambulatory Visit: Payer: Self-pay | Admitting: Physician Assistant

## 2018-01-25 DIAGNOSIS — I10 Essential (primary) hypertension: Secondary | ICD-10-CM

## 2018-01-25 LAB — URINE CULTURE
MICRO NUMBER:: 90588330
SPECIMEN QUALITY:: ADEQUATE

## 2018-01-25 LAB — URINALYSIS, ROUTINE W REFLEX MICROSCOPIC
BILIRUBIN URINE: NEGATIVE
Bacteria, UA: NONE SEEN /HPF
GLUCOSE, UA: NEGATIVE
Hgb urine dipstick: NEGATIVE
Ketones, ur: NEGATIVE
NITRITE: NEGATIVE
PROTEIN: NEGATIVE
Specific Gravity, Urine: 1.026 (ref 1.001–1.03)
pH: 5.5 (ref 5.0–8.0)

## 2018-01-25 MED ORDER — FLUCONAZOLE 150 MG PO TABS: 150.0000 mg | ORAL_TABLET | Freq: Once | ORAL | 0 refills | Status: AC

## 2018-01-25 MED ORDER — CEPHALEXIN 500 MG PO CAPS
500.0000 mg | ORAL_CAPSULE | Freq: Two times a day (BID) | ORAL | 0 refills | Status: DC
Start: 2018-01-25 — End: 2018-02-02

## 2018-01-25 MED ORDER — DUODERM CGF DRESSING EX MISC: CUTANEOUS | 5 refills | Status: AC

## 2018-01-25 NOTE — Addendum Note (Signed)
Addended by: Donella Stade on: 01/25/2018 09:06 AM   Modules accepted: Orders

## 2018-01-25 NOTE — Telephone Encounter (Signed)
Pt grew e.coli. Struggling to find medication that she has not shown resistance to and that she has not had reactions too. I will send over keflex for now and wait for current supseptiblities  report.

## 2018-01-27 ENCOUNTER — Encounter: Payer: Self-pay | Admitting: Physician Assistant

## 2018-01-28 ENCOUNTER — Encounter: Payer: Self-pay | Admitting: Physician Assistant

## 2018-01-28 NOTE — Telephone Encounter (Signed)
Call pt and find out how she is doing. When she has finished keflex as long as symptoms not worsening need repeat urine culture.

## 2018-02-02 ENCOUNTER — Other Ambulatory Visit: Payer: Self-pay | Admitting: Physician Assistant

## 2018-02-02 ENCOUNTER — Encounter: Payer: Self-pay | Admitting: Physician Assistant

## 2018-02-02 MED ORDER — CEPHALEXIN 500 MG PO CAPS: 500.0000 mg | ORAL_CAPSULE | Freq: Two times a day (BID) | ORAL | 0 refills | Status: AC

## 2018-02-02 NOTE — Telephone Encounter (Signed)
Sent referral to [facility] and is getting set up - CF

## 2018-02-09 ENCOUNTER — Encounter: Payer: Self-pay | Admitting: Physician Assistant

## 2018-02-10 DIAGNOSIS — R109 Unspecified abdominal pain: Secondary | ICD-10-CM | POA: Diagnosis not present

## 2018-02-10 DIAGNOSIS — R509 Fever, unspecified: Secondary | ICD-10-CM | POA: Diagnosis not present

## 2018-02-10 DIAGNOSIS — R252 Cramp and spasm: Secondary | ICD-10-CM | POA: Diagnosis not present

## 2018-02-10 DIAGNOSIS — F0390 Unspecified dementia without behavioral disturbance: Secondary | ICD-10-CM | POA: Diagnosis not present

## 2018-03-08 ENCOUNTER — Other Ambulatory Visit: Payer: Self-pay | Admitting: Physician Assistant

## 2018-03-08 ENCOUNTER — Other Ambulatory Visit: Payer: Self-pay | Admitting: Family Medicine

## 2018-03-08 DIAGNOSIS — F330 Major depressive disorder, recurrent, mild: Secondary | ICD-10-CM

## 2018-03-16 ENCOUNTER — Other Ambulatory Visit: Payer: Self-pay | Admitting: Physician Assistant

## 2018-03-23 ENCOUNTER — Other Ambulatory Visit: Payer: Self-pay | Admitting: Physician Assistant

## 2018-04-04 ENCOUNTER — Other Ambulatory Visit: Payer: Self-pay | Admitting: Physician Assistant

## 2018-05-09 ENCOUNTER — Other Ambulatory Visit: Payer: Self-pay | Admitting: Physician Assistant

## 2018-05-26 ENCOUNTER — Other Ambulatory Visit: Payer: Self-pay | Admitting: Physician Assistant

## 2018-05-26 DIAGNOSIS — I10 Essential (primary) hypertension: Secondary | ICD-10-CM

## 2018-05-30 ENCOUNTER — Other Ambulatory Visit: Payer: Self-pay | Admitting: Physician Assistant

## 2018-05-30 DIAGNOSIS — F330 Major depressive disorder, recurrent, mild: Secondary | ICD-10-CM

## 2018-06-22 ENCOUNTER — Other Ambulatory Visit: Payer: Self-pay | Admitting: Physician Assistant

## 2018-06-22 DIAGNOSIS — F330 Major depressive disorder, recurrent, mild: Secondary | ICD-10-CM

## 2018-07-07 ENCOUNTER — Other Ambulatory Visit: Payer: Self-pay | Admitting: Physician Assistant

## 2018-07-09 ENCOUNTER — Other Ambulatory Visit: Payer: Self-pay | Admitting: Physician Assistant

## 2018-07-09 DIAGNOSIS — F330 Major depressive disorder, recurrent, mild: Secondary | ICD-10-CM

## 2018-07-19 ENCOUNTER — Other Ambulatory Visit: Payer: Self-pay | Admitting: Physician Assistant

## 2018-07-19 DIAGNOSIS — F330 Major depressive disorder, recurrent, mild: Secondary | ICD-10-CM

## 2018-07-19 DIAGNOSIS — N390 Urinary tract infection, site not specified: Secondary | ICD-10-CM

## 2018-08-01 ENCOUNTER — Other Ambulatory Visit: Payer: Self-pay | Admitting: Physician Assistant

## 2018-08-01 DIAGNOSIS — F330 Major depressive disorder, recurrent, mild: Secondary | ICD-10-CM

## 2018-10-10 ENCOUNTER — Other Ambulatory Visit: Payer: Self-pay | Admitting: Physician Assistant

## 2018-10-26 ENCOUNTER — Other Ambulatory Visit: Payer: Self-pay | Admitting: Physician Assistant

## 2018-11-23 ENCOUNTER — Other Ambulatory Visit: Payer: Self-pay | Admitting: Physician Assistant

## 2018-12-02 ENCOUNTER — Other Ambulatory Visit: Payer: Self-pay | Admitting: Physician Assistant

## 2018-12-12 DEATH — deceased

## 2018-12-27 ENCOUNTER — Other Ambulatory Visit: Payer: Self-pay | Admitting: Physician Assistant

## 2019-11-01 IMAGING — DX DG CHEST 2V
2 series · 2 of 2 positions shown · non-contrast
Comparison: 11/10/2014

CLINICAL DATA: Cough

EXAM:
CHEST  2 VIEW

[chest lat]
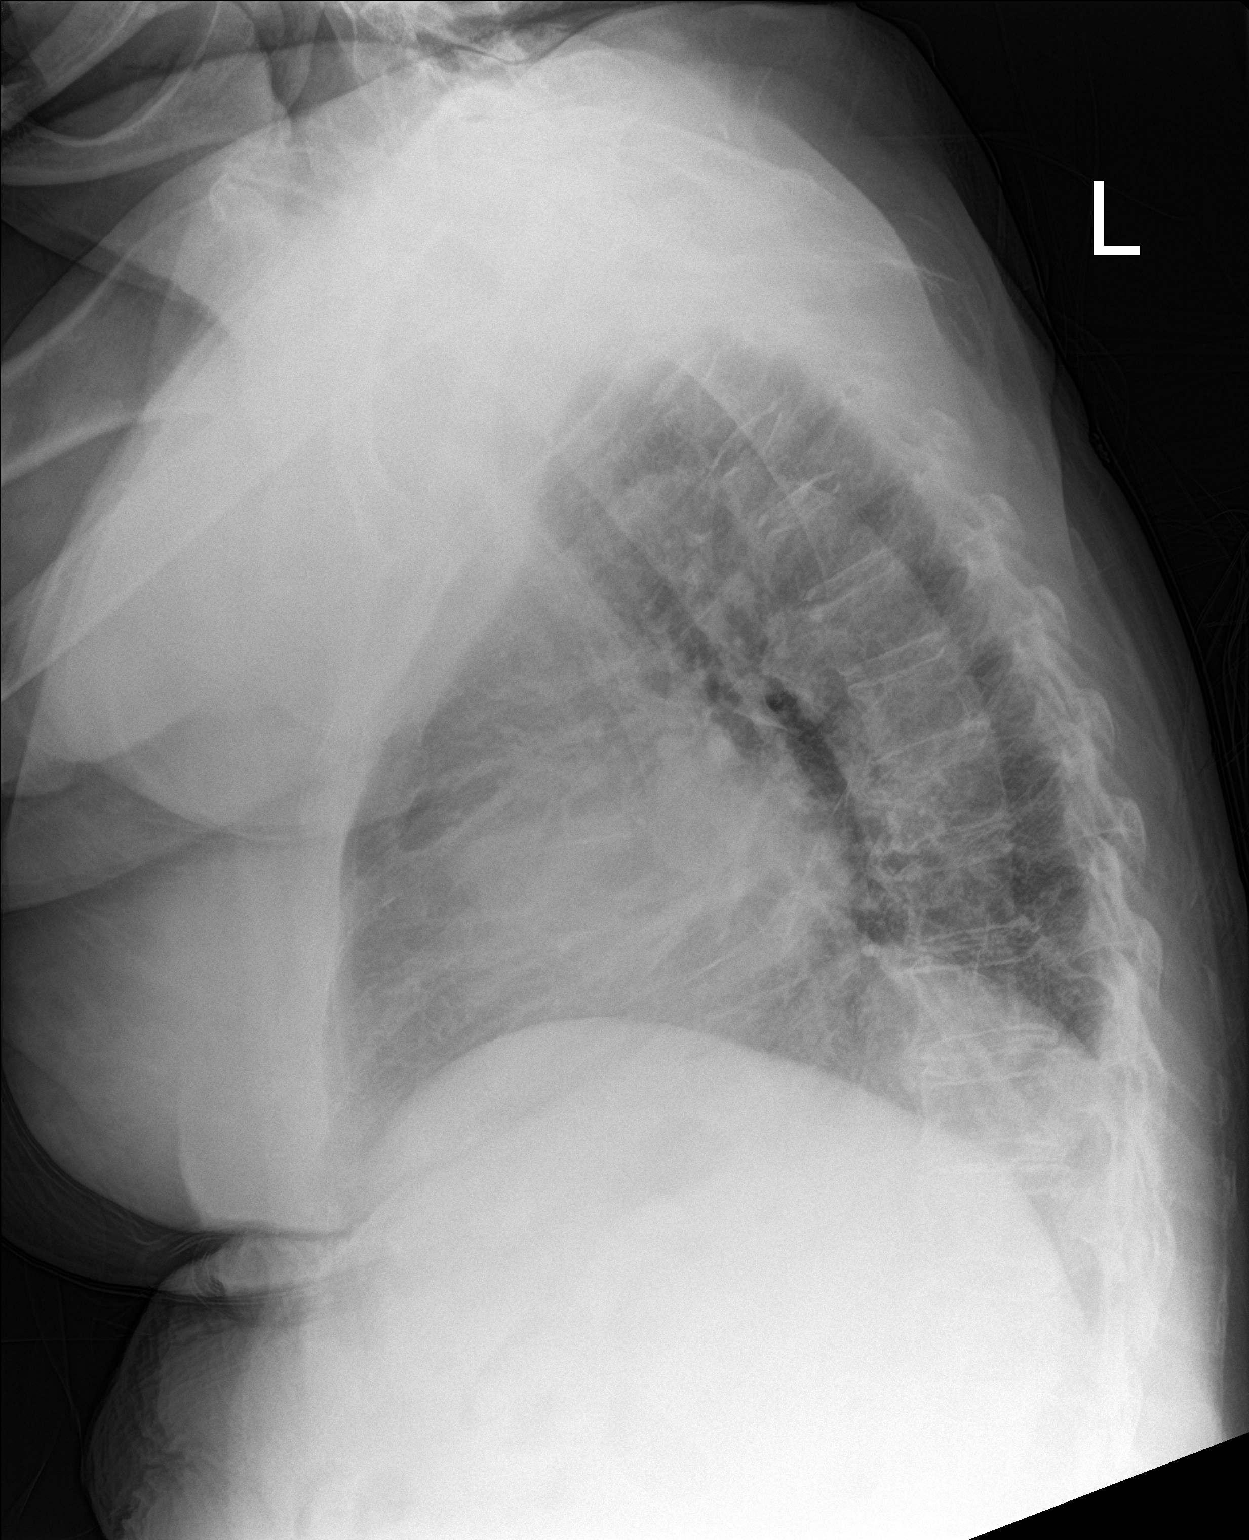

[chest ap]
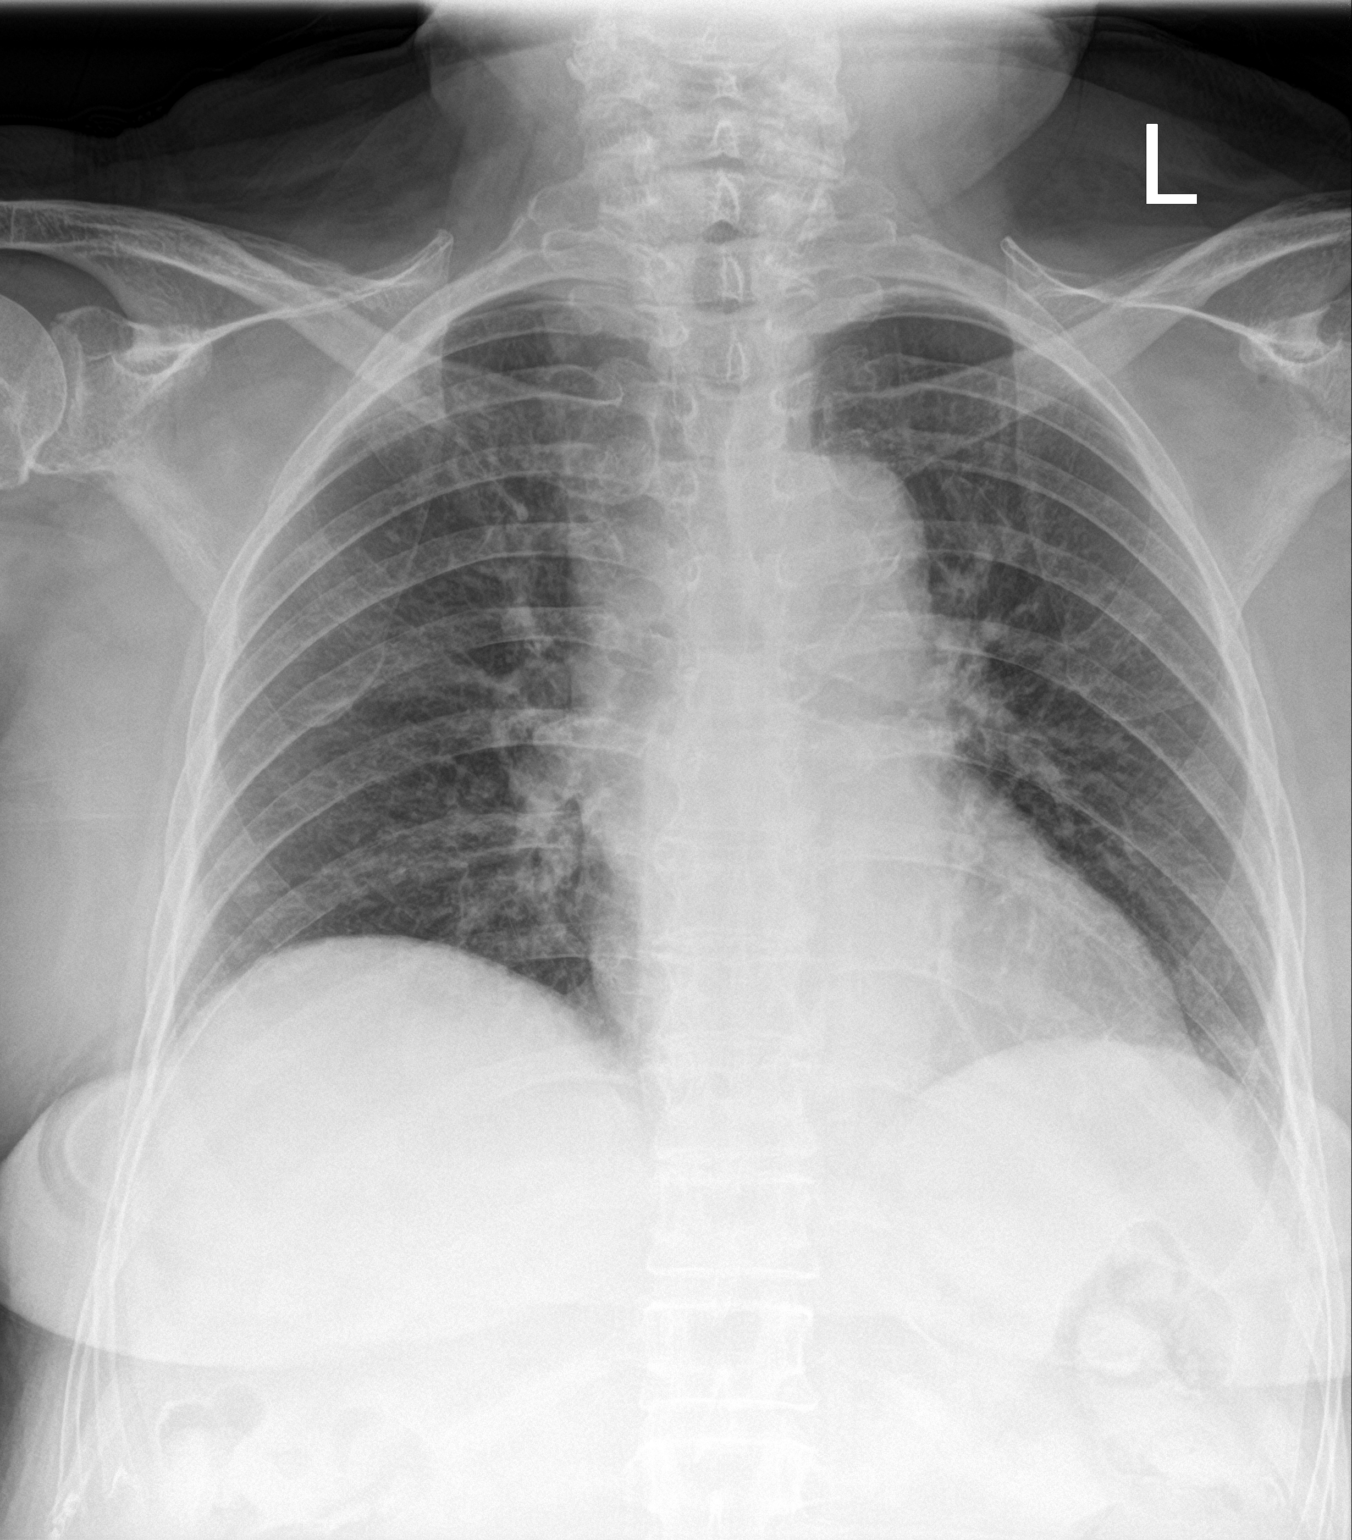

[2 of 2 positions shown; findings below may reference images not displayed]

FINDINGS: The heart size and mediastinal contours are within normal limits.
Both lungs are clear. The visualized skeletal structures are
unremarkable.
IMPRESSION: No active cardiopulmonary disease.

## 2019-11-01 IMAGING — DX DG ABDOMEN 1V
2 series · 2 of 2 positions shown · non-contrast
Comparison: None.

CLINICAL DATA: Acute cystitis with hematuria.  Flank pain

EXAM:
ABDOMEN - 1 VIEW

[abdomen kub (1 of 2)]
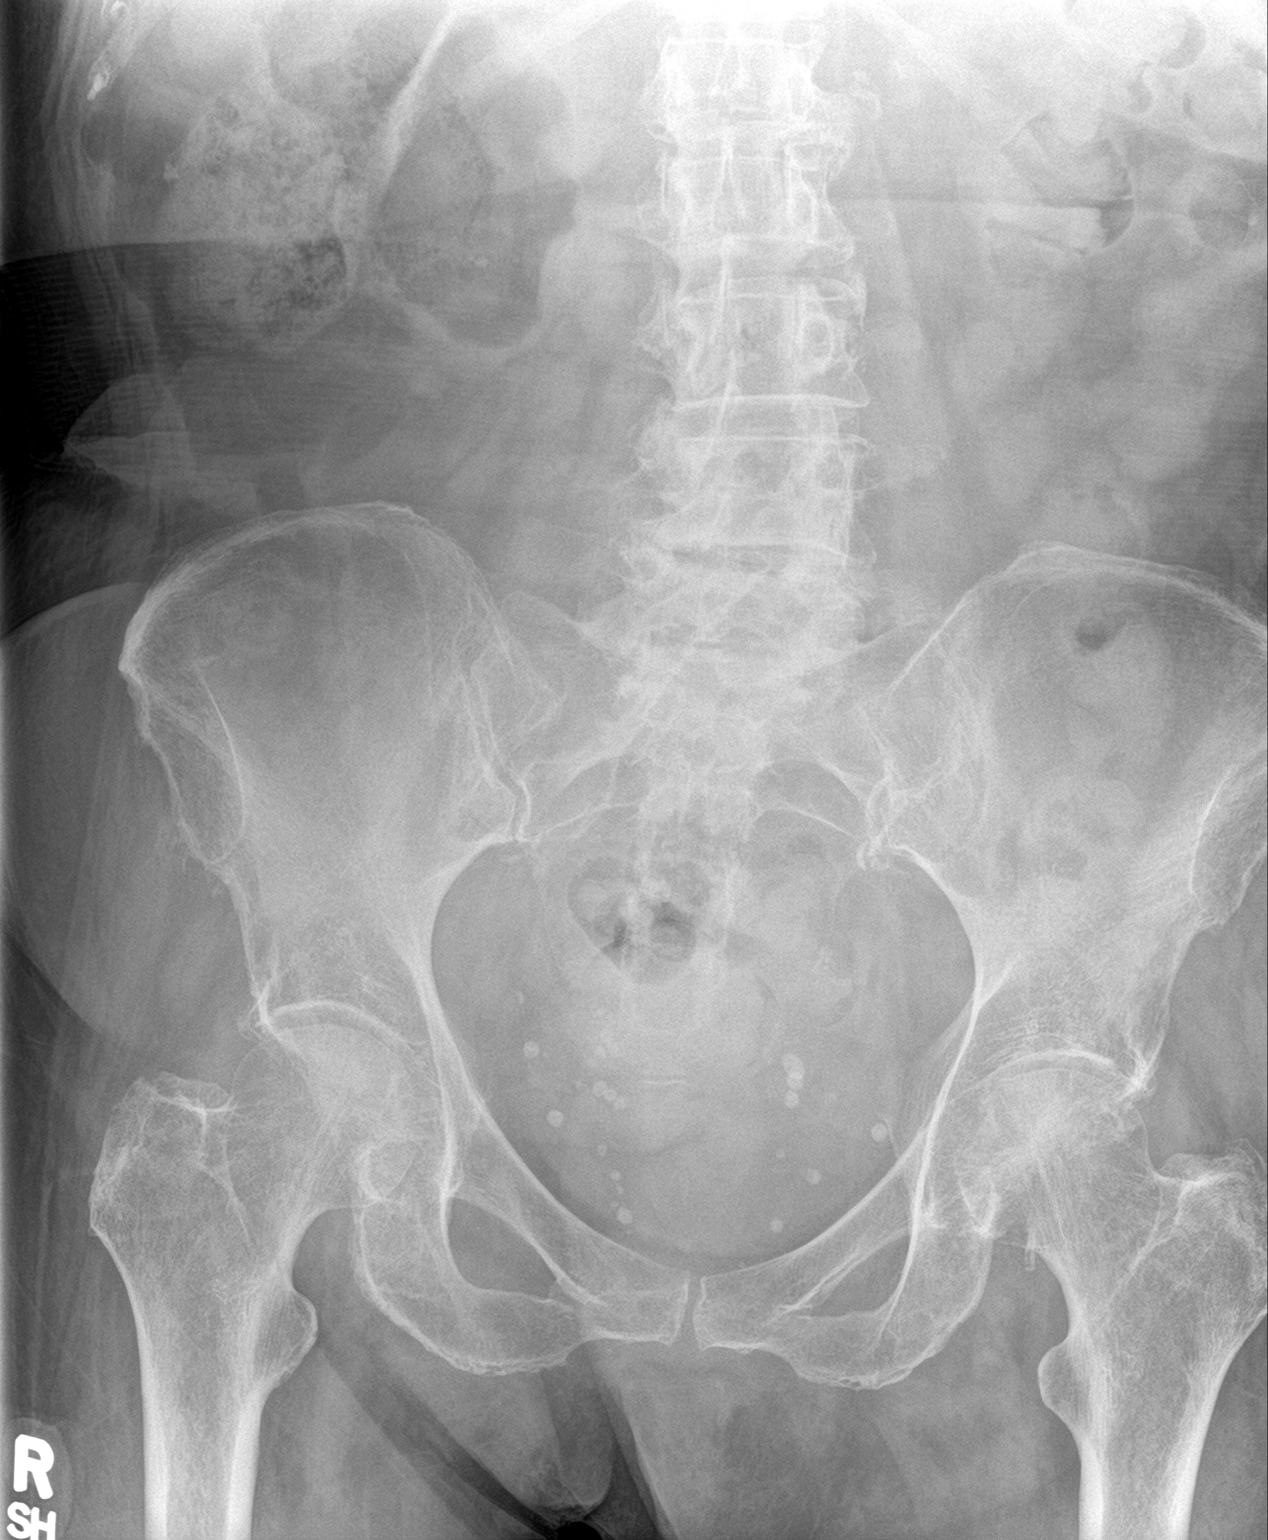

[abdomen kub (2 of 2)]
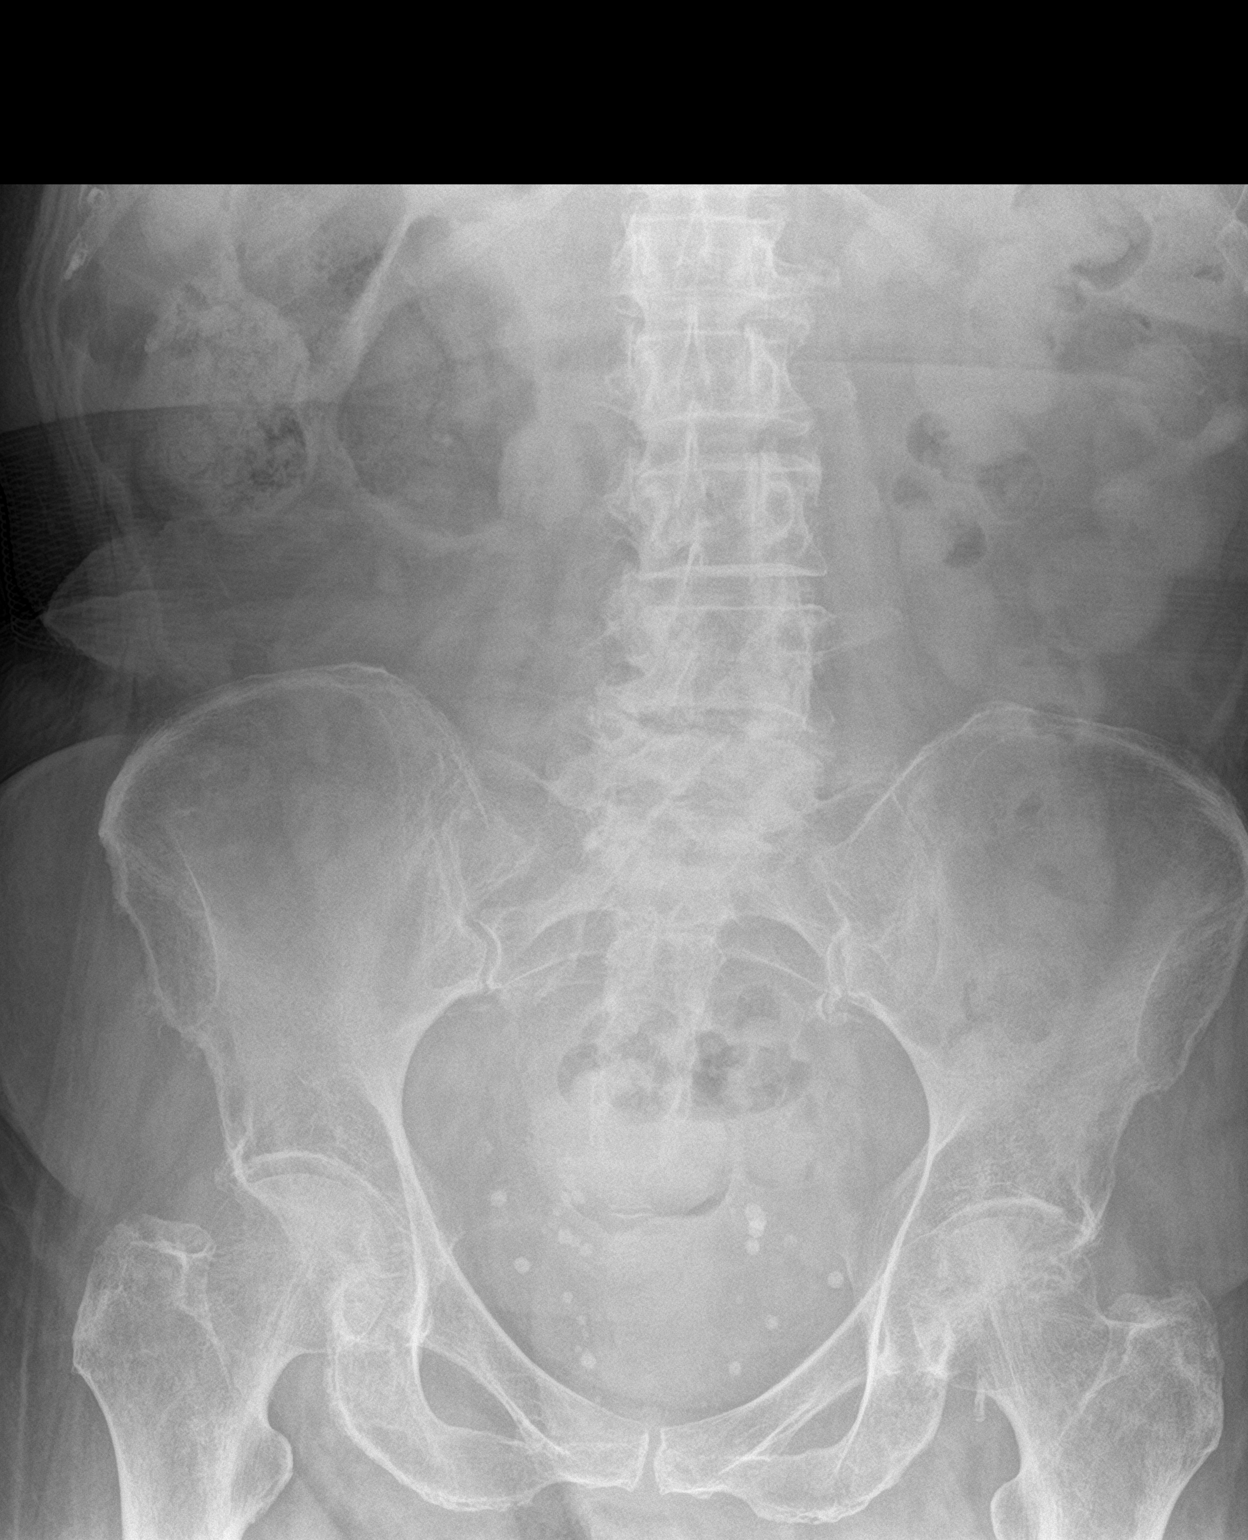

[2 of 2 positions shown; findings below may reference images not displayed]

FINDINGS: Normal bowel gas pattern. Negative for urinary tract calculi.
Numerous phleboliths in the pelvis. Mild atherosclerotic
calcification.

Lumbar scoliosis and degenerative change.
IMPRESSION: Negative.
